# Patient Record
Sex: Male | Born: 1935 | Hispanic: Yes | Marital: Married | Smoking: Never smoker
Health system: Southern US, Community
[De-identification: ages and names within clinical notes are randomized; demographics above are authoritative.]

## PROBLEM LIST (undated history)

## (undated) DIAGNOSIS — I10 Essential (primary) hypertension: Secondary | ICD-10-CM

---

## 2007-02-20 ENCOUNTER — Ambulatory Visit: Payer: Self-pay | Admitting: Ophthalmology

## 2007-02-27 ENCOUNTER — Ambulatory Visit: Payer: Self-pay | Admitting: Ophthalmology

## 2008-02-13 ENCOUNTER — Ambulatory Visit: Payer: Self-pay | Admitting: Gastroenterology

## 2010-05-10 ENCOUNTER — Emergency Department: Payer: Self-pay | Admitting: Emergency Medicine

## 2011-08-29 ENCOUNTER — Ambulatory Visit: Payer: Self-pay | Admitting: Gastroenterology

## 2011-08-31 LAB — PATHOLOGY REPORT

## 2011-10-29 ENCOUNTER — Emergency Department: Payer: Self-pay | Admitting: Emergency Medicine

## 2011-10-29 LAB — CBC WITH DIFFERENTIAL/PLATELET
Basophil #: 0 10*3/uL (ref 0.0–0.1)
Eosinophil #: 0.2 10*3/uL (ref 0.0–0.7)
Eosinophil %: 1.8 %
HCT: 43.5 % (ref 40.0–52.0)
HGB: 14.7 g/dL (ref 13.0–18.0)
Lymphocyte #: 0.5 10*3/uL — ABNORMAL LOW (ref 1.0–3.6)
Lymphocyte %: 5.2 %
MCH: 29.3 pg (ref 26.0–34.0)
MCHC: 33.7 g/dL (ref 32.0–36.0)
Monocyte #: 0.6 x10 3/mm (ref 0.2–1.0)
Platelet: 215 10*3/uL (ref 150–440)
RBC: 5.02 10*6/uL (ref 4.40–5.90)
RDW: 14.4 % (ref 11.5–14.5)

## 2011-10-29 LAB — BASIC METABOLIC PANEL
Anion Gap: 9 (ref 7–16)
BUN: 14 mg/dL (ref 7–18)
Calcium, Total: 9 mg/dL (ref 8.5–10.1)
EGFR (Non-African Amer.): >60
Glucose: 74 mg/dL (ref 65–99)
Osmolality: 280 (ref 275–301)

## 2013-04-03 ENCOUNTER — Ambulatory Visit: Payer: Self-pay | Admitting: Family Medicine

## 2016-07-30 ENCOUNTER — Emergency Department: Payer: Medicare Other

## 2016-07-30 ENCOUNTER — Emergency Department
Admission: EM | Admit: 2016-07-30 | Discharge: 2016-07-30 | Disposition: A | Discharge status: Home / Self Care | Payer: Medicare Other | Attending: Emergency Medicine | Admitting: Emergency Medicine

## 2016-07-30 DIAGNOSIS — J40 Bronchitis, not specified as acute or chronic: Secondary | ICD-10-CM | POA: Insufficient documentation

## 2016-07-30 DIAGNOSIS — I10 Essential (primary) hypertension: Secondary | ICD-10-CM | POA: Diagnosis not present

## 2016-07-30 DIAGNOSIS — R0602 Shortness of breath: Secondary | ICD-10-CM | POA: Diagnosis present

## 2016-07-30 HISTORY — DX: Essential (primary) hypertension: I10

## 2016-07-30 LAB — HEPATIC FUNCTION PANEL
ALBUMIN: 4.1 g/dL (ref 3.5–5.0)
ALK PHOS: 82 U/L (ref 38–126)
ALT: 19 U/L (ref 17–63)
AST: 29 U/L (ref 15–41)
Bilirubin, Direct: 0.1 mg/dL (ref 0.1–0.5)
Indirect Bilirubin: 0.7 mg/dL (ref 0.3–0.9)
TOTAL PROTEIN: 7 g/dL (ref 6.5–8.1)
Total Bilirubin: 0.8 mg/dL (ref 0.3–1.2)

## 2016-07-30 LAB — BASIC METABOLIC PANEL
ANION GAP: 6 (ref 5–15)
BUN: 16 mg/dL (ref 6–20)
CO2: 26 mmol/L (ref 22–32)
Calcium: 9.2 mg/dL (ref 8.9–10.3)
Chloride: 108 mmol/L (ref 101–111)
Creatinine, Ser: 1.04 mg/dL (ref 0.61–1.24)
Glucose, Bld: 103 mg/dL — ABNORMAL HIGH (ref 65–99)
POTASSIUM: 4.1 mmol/L (ref 3.5–5.1)
Sodium: 140 mmol/L (ref 135–145)

## 2016-07-30 LAB — CBC WITH DIFFERENTIAL/PLATELET
BASOS ABS: 0.1 10*3/uL (ref 0–0.1)
BASOS PCT: 1 %
EOS ABS: 0.2 10*3/uL (ref 0–0.7)
Eosinophils Relative: 3 %
HCT: 45.1 % (ref 40.0–52.0)
HEMOGLOBIN: 15.1 g/dL (ref 13.0–18.0)
Lymphocytes Relative: 8 %
Lymphs Abs: 0.7 10*3/uL — ABNORMAL LOW (ref 1.0–3.6)
MCH: 28.2 pg (ref 26.0–34.0)
MCHC: 33.5 g/dL (ref 32.0–36.0)
MCV: 84.1 fL (ref 80.0–100.0)
MONOS PCT: 6 %
Monocytes Absolute: 0.5 10*3/uL (ref 0.2–1.0)
NEUTROS PCT: 82 %
Neutro Abs: 6.8 10*3/uL — ABNORMAL HIGH (ref 1.4–6.5)
Platelets: 219 10*3/uL (ref 150–440)
RBC: 5.35 MIL/uL (ref 4.40–5.90)
RDW: 14.1 % (ref 11.5–14.5)
WBC: 8.2 10*3/uL (ref 3.8–10.6)

## 2016-07-30 LAB — CBC
HEMATOCRIT: 47.2 % (ref 40.0–52.0)
HEMOGLOBIN: 16 g/dL (ref 13.0–18.0)
MCH: 28.6 pg (ref 26.0–34.0)
MCHC: 33.9 g/dL (ref 32.0–36.0)
MCV: 84.2 fL (ref 80.0–100.0)
Platelets: 231 10*3/uL (ref 150–440)
RBC: 5.61 MIL/uL (ref 4.40–5.90)
RDW: 14 % (ref 11.5–14.5)
WBC: 8.6 10*3/uL (ref 3.8–10.6)

## 2016-07-30 LAB — LACTIC ACID, PLASMA: LACTIC ACID, VENOUS: 1 mmol/L (ref 0.5–1.9)

## 2016-07-30 LAB — INFLUENZA PANEL BY PCR (TYPE A & B)
INFLAPCR: NEGATIVE
Influenza B By PCR: NEGATIVE

## 2016-07-30 LAB — TROPONIN I

## 2016-07-30 MED ORDER — AZITHROMYCIN 250 MG PO TABS: ORAL_TABLET | ORAL | 0 refills | Status: AC

## 2016-07-30 MED ORDER — IOPAMIDOL (ISOVUE-300) INJECTION 61%
75.0000 mL | Freq: Once | INTRAVENOUS | Status: AC | PRN
  Administered 2016-07-30: 75 mL via INTRAVENOUS

## 2016-07-30 MED ORDER — SODIUM CHLORIDE 0.9 % IV SOLN
Freq: Once | INTRAVENOUS | Status: AC
  Administered 2016-07-30: 14:00:00 via INTRAVENOUS

## 2016-07-30 MED ORDER — ALBUTEROL SULFATE HFA 108 (90 BASE) MCG/ACT IN AERS: 2.0000 | INHALATION_SPRAY | Freq: Four times a day (QID) | RESPIRATORY_TRACT | 2 refills | Status: AC | PRN

## 2016-07-30 NOTE — ED Notes (Signed)
This RN to bedside due to call light going off. This RN apologized profusely for delay, pt's daughter states, "the service has been really bad here today". This RN explained delay to patient and daughter. Pt's daughter states, " there isn't a nurse here who speaks spanish?" This RN explained to patient that there was not a medically certified translator on staff at this time and would have to wait for interpreter to review D/C instructions. Pt appears agitated at this time due to delay, this RN apologized once again. Pt is visualized in NAD at this time. Pt is resting in bed with his daughter and her husband at bedside. This RN explained that there were other sick patients that needed tending too, pt's daughter states "I don't care I have been here when it has been really really bad and the service today was awful.".

## 2016-07-30 NOTE — ED Notes (Signed)
Dr. Darnelle CatalanMalinda and [place], Interpreter at bedside at this time.

## 2016-07-30 NOTE — ED Notes (Signed)
This RN to bedside, pt's family at bedside at this time, requesting another chair for the room. This RN obtained another chair for them, denies further needs at this time.

## 2016-07-30 NOTE — ED Triage Notes (Signed)
Pt came to ED c/o cough, sob, left arm and back pain. Reports tingling all the way down left arm and numbness in left hand. Reports has been going on for about 1 week.

## 2016-07-30 NOTE — ED Provider Notes (Signed)
[facility] Emergency Department Provider Note   ____________________________________________   First MD Initiated Contact with Patient 07/30/16 1109     (approximate)  I have reviewed the triage vital signs and the nursing notes.   HISTORY  Chief Complaint Shortness of Breath; Arm Pain; and Cough    HPI Mccartney Chuba is a 83 y.o. male patient reports feeling weak this morning. He is having a pain in his back when he coughs. His middle of his back. He is not running a fever. Aching all over but he's had a pain in his left arm from shoulder down into the hand is worse with movement. Appears to be sharp stabbing pain. Been going on for just less than a year. He has not told his doctor about it. He usually sees Phineas Real. Patient also reports that for sometime he's been having a pain just before he starts to urinate but urinates without any difficulty and has no pain while urinating.   Past Medical History:  Diagnosis Date  . Hypertension     There are no active problems to display for this patient.   History reviewed. No pertinent surgical history.  Prior to Admission medications   Not on File    Allergies Patient has no known allergies.  No family history on file.  Social History Social History  Substance Use Topics  . Smoking status: Never Smoker  . Smokeless tobacco: Never Used  . Alcohol use 0.6 oz/week    1 Shots of liquor per week     Comment: shot of tequila daily    Review of Systems Constitutional: No fever/chills Eyes: No visual changes. ENT: No sore throat. Cardiovascular: Denies chest pain. Respiratory: Denies shortness of breath. Gastrointestinal: No abdominal pain.  No nausea, no vomiting.  No diarrhea.  No constipation. Genitourinary: Negative for dysuria. Musculoskeletal: back pain. Skin: Negative for rash. Neurological: Negative for headaches, focal weakness or numbness.  10-point ROS otherwise  negative.  ____________________________________________   PHYSICAL EXAM:  VITAL SIGNS: ED Triage Vitals  Enc Vitals Group     BP 07/30/16 1045 (!) 191/57     Pulse Rate 07/30/16 1045 85     Resp 07/30/16 1045 16     Temp 07/30/16 1045 98.3 F (36.8 C)     Temp Source 07/30/16 1045 Oral     SpO2 07/30/16 1045 97 %     Weight 07/30/16 1042 130 lb (59 kg)     Height 07/30/16 1042 5\' 1"  (1.549 m)     Head Circumference --      Peak Flow --      Pain Score --      Pain Loc --      Pain Edu? --      Excl. in GC? --     Constitutional: Alert and oriented. Well appearing and in no acute distress. Eyes: Conjunctivae are normal. PERRL. EOMI. Head: Atraumatic. Nose: No congestion/rhinnorhea. Mouth/Throat: Mucous membranes are moist.  Oropharynx non-erythematous. Neck: No stridor.  Cardiovascular: Normal rate, regular rhythm. Grossly normal heart sounds.  Good peripheral circulation. Respiratory: Normal respiratory effort.  No retractions. Lungs CTAB. Gastrointestinal: Soft and nontender. No distention. No abdominal bruits. No CVA tenderness. Musculoskeletal: No lower extremity tenderness nor edema.  No joint effusions. Neurologic:  Normal speech and language. No gross focal neurologic deficits are appreciated. Cranial nerves II through XII are intact although I did not check the visual fields motor strength is 5 over 5 throughout sensation is intact  there is no numbness at all associated with the pain in his arm. Skin:  Skin is warm, dry and intact. No rash noted.   ____________________________________________   LABS (all labs ordered are listed, but only abnormal results are displayed)  Labs Reviewed  BASIC METABOLIC PANEL - Abnormal; Notable for the following:       Result Value   Glucose, Bld 103 (*)    All other components within normal limits  CBC WITH DIFFERENTIAL/PLATELET - Abnormal; Notable for the following:    Neutro Abs 6.8 (*)    Lymphs Abs 0.7 (*)    All other  components within normal limits  CBC  TROPONIN I  TROPONIN I  LACTIC ACID, PLASMA  HEPATIC FUNCTION PANEL  INFLUENZA PANEL BY PCR (TYPE A & B)   ____________________________________________  EKG  EKG read and interpreted by me shows normal sinus rhythm rate of 86 normal axis essentially normal EKG ____________________________________________  RADIOLOGY Study Result   CLINICAL DATA:  Cough, shortness of breath.  EXAM: CHEST  2 VIEW  COMPARISON:  Radiographs of Oct 29, 2011.  FINDINGS: The heart size and mediastinal contours are within normal limits. Elevated right hemidiaphragm is noted. No pneumothorax or pleural effusion is noted. Atherosclerosis of thoracic aorta is noted. Both lungs are clear. The visualized skeletal structures are unremarkable.  IMPRESSION: No active cardiopulmonary disease.  Aortic atherosclerosis.   Electronically Signed   By: Lupita RaiderJames  Green Jr, M.D.   On: 07/30/2016 11:30   Study Result   CLINICAL DATA:  C/o cough, sob, left arm and back pain. Reports tingling all the way down left arm and numbness in left hand. Reports has been going on for about 1 week. 75 ml isovue 300 given.^8175mL ISOVUE-300 IOPAMIDOL (ISOVUE-300) INJECTION 61%  EXAM: CT CHEST WITH CONTRAST  TECHNIQUE: Multidetector CT imaging of the chest was performed during intravenous contrast administration.  CONTRAST:  75mL ISOVUE-300 IOPAMIDOL (ISOVUE-300) INJECTION 61%  COMPARISON:  Chest x-ray 02/16/2017  FINDINGS: Cardiovascular: Coronary artery calcifications are present. Heart is normal in size. No pericardial effusion. There is atherosclerotic calcification of the thoracic aorta. No aneurysm. Main pulmonary arteries are normally opacified.  Mediastinum/Nodes: The visualized portion of the thyroid gland has a normal appearance. No mediastinal, hilar, or axillary adenopathy.  Lungs/Pleura: Airways are patent. There is mild perihilar peribronchial  thickening. No focal consolidations or pleural effusions. Minimal atelectasis identified at the lung bases. Elevation of the right hemidiaphragm.  Upper Abdomen: Gallbladder is present.  Musculoskeletal: Degenerative changes are seen in thoracic spine.  IMPRESSION: 1. Mild bronchitic changes. 2. No focal consolidation or pleural effusions. 3. Elevation of the right hemidiaphragm of uncertain etiology. 4. Atherosclerosis of the thoracic aorta.  Coronary artery disease.   Electronically Signed   By: Norva PavlovElizabeth  Brown M.D.   On: 07/30/2016 14:13    ____________________________________________   PROCEDURES  Procedure(s) performed:   Procedures  Critical Care performed:   ____________________________________________   INITIAL IMPRESSION / ASSESSMENT AND PLAN / ED COURSE  Pertinent labs & imaging results that were available during my care of the patient were reviewed by me and considered in my medical decision making (see chart for details).   Urine is clear looking.     ____________________________________________   FINAL CLINICAL IMPRESSION(S) / ED DIAGNOSES  Final diagnoses:  Bronchitis      NEW MEDICATIONS STARTED DURING THIS VISIT:  New Prescriptions   No medications on file     Note:  This document was prepared using Dragon voice  recognition software and may include unintentional dictation errors.    Arnaldo Natal, MD 07/30/16 1630

## 2016-07-30 NOTE — ED Notes (Signed)
MD aware pt's temp 100.3. Pt states he does not want to wait, instructed to begin prescription tonight and take tylenol when he got home for fevers. Pt states understanding at this time. MD aware of patien'ts BP 175/64, states okay for D/C at this time. Pt visualized in NAD at this time. Rafael, Interpreter at bedside at this time to review D/C instructions. Pt refuses wheelchair to the lobby at this time. Pt denies any comments/concerns.

## 2016-07-30 NOTE — ED Notes (Signed)
This RN to bedside to obtain blood and flu swab. This RN used pre-existing IV for blood draw, pt tolerated flu swab well at this time. Will continue to monitor for further patient needs.

## 2016-07-30 NOTE — Discharge Instructions (Signed)
I will give you some Zithromax 2 pills on the first day and one every day after that an antibiotic to help with the cough. You can use an over-the-counter cough medicine as well. I will also give you an albuterol inhaler 2 puffs 4 times a day as long as the cough is going on as I demonstrated. Please return if you're worse or no better in 2-3 days. [facility] Please have your doctor evaluate your arm pain as well. I cannot find anything at present that would explain it.

## 2016-07-30 NOTE — ED Notes (Addendum)
Pt visualized in NAD at this time. Pt resting in bed with family at bedside. Pt's family denies any needs. Will continue to monitor for further patient needs at this time.

## 2016-07-30 NOTE — ED Notes (Signed)
This RN to bedside at this time. Pt's family states that patient is getting tired of laying down. Pt is visualized in NAD. This RN explained to patient family that she would speak with MD. Pt's family states understanding at this time.

## 2017-07-07 ENCOUNTER — Encounter: Payer: Self-pay | Admitting: *Deleted

## 2017-09-21 ENCOUNTER — Emergency Department
Admission: EM | Admit: 2017-09-21 | Discharge: 2017-09-21 | Disposition: A | Discharge status: Home / Self Care | Payer: Medicare Other | Attending: Emergency Medicine | Admitting: Emergency Medicine

## 2017-09-21 ENCOUNTER — Other Ambulatory Visit: Payer: Self-pay

## 2017-09-21 DIAGNOSIS — N289 Disorder of kidney and ureter, unspecified: Secondary | ICD-10-CM

## 2017-09-21 DIAGNOSIS — R112 Nausea with vomiting, unspecified: Secondary | ICD-10-CM | POA: Insufficient documentation

## 2017-09-21 DIAGNOSIS — I10 Essential (primary) hypertension: Secondary | ICD-10-CM | POA: Insufficient documentation

## 2017-09-21 DIAGNOSIS — R197 Diarrhea, unspecified: Secondary | ICD-10-CM | POA: Diagnosis not present

## 2017-09-21 LAB — URINALYSIS, COMPLETE (UACMP) WITH MICROSCOPIC
BILIRUBIN URINE: NEGATIVE
Glucose, UA: NEGATIVE mg/dL
HGB URINE DIPSTICK: NEGATIVE
Ketones, ur: 5 mg/dL — AB
LEUKOCYTES UA: NEGATIVE
Nitrite: NEGATIVE
PROTEIN: 30 mg/dL — AB
SPECIFIC GRAVITY, URINE: 1.019 (ref 1.005–1.030)
pH: 5 (ref 5.0–8.0)

## 2017-09-21 LAB — COMPREHENSIVE METABOLIC PANEL
ALBUMIN: 4.6 g/dL (ref 3.5–5.0)
ALT: 26 U/L (ref 17–63)
AST: 43 U/L — AB (ref 15–41)
Alkaline Phosphatase: 102 U/L (ref 38–126)
Anion gap: 13 (ref 5–15)
BUN: 30 mg/dL — AB (ref 6–20)
CHLORIDE: 101 mmol/L (ref 101–111)
CO2: 22 mmol/L (ref 22–32)
CREATININE: 1.26 mg/dL — AB (ref 0.61–1.24)
Calcium: 9.3 mg/dL (ref 8.9–10.3)
GFR calc Af Amer: 59 mL/min — ABNORMAL LOW (ref >60)
GFR calc non Af Amer: 51 mL/min — ABNORMAL LOW (ref >60)
GLUCOSE: 102 mg/dL — AB (ref 65–99)
POTASSIUM: 4.3 mmol/L (ref 3.5–5.1)
SODIUM: 136 mmol/L (ref 135–145)
Total Bilirubin: 1.2 mg/dL (ref 0.3–1.2)
Total Protein: 8.3 g/dL — ABNORMAL HIGH (ref 6.5–8.1)

## 2017-09-21 LAB — CBC
HEMATOCRIT: 49.7 % (ref 40.0–52.0)
Hemoglobin: 16.5 g/dL (ref 13.0–18.0)
MCH: 27.8 pg (ref 26.0–34.0)
MCHC: 33.2 g/dL (ref 32.0–36.0)
MCV: 83.6 fL (ref 80.0–100.0)
PLATELETS: 241 10*3/uL (ref 150–440)
RBC: 5.95 MIL/uL — ABNORMAL HIGH (ref 4.40–5.90)
RDW: 14.6 % — AB (ref 11.5–14.5)
WBC: 11.9 10*3/uL — ABNORMAL HIGH (ref 3.8–10.6)

## 2017-09-21 LAB — INFLUENZA PANEL BY PCR (TYPE A & B)
Influenza A By PCR: NEGATIVE
Influenza B By PCR: NEGATIVE

## 2017-09-21 LAB — LIPASE, BLOOD: LIPASE: 26 U/L (ref 11–51)

## 2017-09-21 MED ORDER — SODIUM CHLORIDE 0.9 % IV BOLUS
1000.0000 mL | Freq: Once | INTRAVENOUS | Status: AC
  Administered 2017-09-21: 1000 mL via INTRAVENOUS

## 2017-09-21 MED ORDER — ONDANSETRON 4 MG PO TBDP: 4.0000 mg | ORAL_TABLET | Freq: Three times a day (TID) | ORAL | 0 refills | Status: DC | PRN

## 2017-09-21 MED ORDER — ONDANSETRON HCL 4 MG/2ML IJ SOLN
4.0000 mg | Freq: Once | INTRAMUSCULAR | Status: AC
  Administered 2017-09-21: 4 mg via INTRAVENOUS
  Filled 2017-09-21: qty 2

## 2017-09-21 MED ORDER — LOPERAMIDE HCL 2 MG PO TABS: 2.0000 mg | ORAL_TABLET | Freq: Four times a day (QID) | ORAL | 0 refills | Status: DC | PRN

## 2017-09-21 NOTE — ED Provider Notes (Signed)
[facility] Emergency Department Provider Note  ____________________________________________  Time seen: Approximately 7:25 PM  I have reviewed the triage vital signs and the nursing notes.   HISTORY  Chief Complaint Emesis and Diarrhea    HPI Drew Sharp is a 84 y.o. male with a history of hypertension presenting for nausea vomiting and diarrhea.  The patient reports that he ate pork ribs and scrambled eggs last night and in the evening began to have multiple episodes of nausea vomiting and nonbloody diarrhea.  He has not had any abdominal pain, fevers or chills, dysuria.  No cough or cold symptoms.  Past Medical History:  Diagnosis Date  . Hypertension     There are no active problems to display for this patient.   History reviewed. No pertinent surgical history.  Current Outpatient Rx  . Order #: [id number] Class: Print  . Order #: [id number] Class: Print  . Order #: [id number] Class: Print    Allergies Patient has no known allergies.  No family history on file.  Social History Social History   Tobacco Use  . Smoking status: Never Smoker  . Smokeless tobacco: Never Used  Substance Use Topics  . Alcohol use: Yes    Alcohol/week: 0.6 oz    Types: 1 Shots of liquor per week    Comment: shot of tequila daily  . Drug use: No    Review of Systems Constitutional: No fever/chills.  No lightheadedness or syncope.  No diaphoresis. Eyes: No visual changes.  No eye discharge. ENT: No sore throat. No congestion or rhinorrhea. Cardiovascular: Denies chest pain. Denies palpitations. Respiratory: Denies shortness of breath.  No cough. Gastrointestinal: No abdominal pain.  +nausea, +vomiting.  +diarrhea.  No constipation. Genitourinary: Negative for dysuria.  No urinary frequency.  No hematuria. Musculoskeletal: Negative for back pain. Skin: Negative for rash. Neurological: Negative for headaches. No focal numbness, tingling or weakness.      ____________________________________________   PHYSICAL EXAM:  VITAL SIGNS: ED Triage Vitals  Enc Vitals Group     BP 09/21/17 1511 (!) 162/63     Pulse Rate 09/21/17 1511 (!) 113     Resp 09/21/17 1511 16     Temp 09/21/17 1511 98.7 F (37.1 C)     Temp Source 09/21/17 1511 Oral     SpO2 09/21/17 1511 95 %     Weight 09/21/17 1509 140 lb (63.5 kg)     Height 09/21/17 1509 5\' 1"  (1.549 m)     Head Circumference --      Peak Flow --      Pain Score 09/21/17 1509 0     Pain Loc --      Pain Edu? --      Excl. in GC? --     Constitutional: Alert and oriented. Well appearing and in no acute distress. Answers questions appropriately. Eyes: Conjunctivae are normal.  EOMI. No scleral icterus. Head: Atraumatic. Nose: No congestion/rhinnorhea. Mouth/Throat: Mucous membranes are mildly dry.  Neck: No stridor.  Supple.  No JVD.  No meningismus. Cardiovascular: Normal rate, regular rhythm. No murmurs, rubs or gallops.  Respiratory: Normal respiratory effort.  No accessory muscle use or retractions. Lungs CTAB.  No wheezes, rales or ronchi. Gastrointestinal: Soft, nontender and nondistended.  No guarding or rebound.  No peritoneal signs. Musculoskeletal: No LE edema. No ttp in the calves or palpable cords.  Negative Homan's sign. Neurologic:  A&Ox3.  Speech is clear.  Face and smile are symmetric.  EOMI.  Moves all extremities well.  Skin:  Skin is warm, dry and intact. No rash noted. Psychiatric: Mood and affect are normal. Speech and behavior are normal.  Normal judgement  ____________________________________________   LABS (all labs ordered are listed, but only abnormal results are displayed)  Labs Reviewed  COMPREHENSIVE METABOLIC PANEL - Abnormal; Notable for the following components:      Result Value   Glucose, Bld 102 (*)    BUN 30 (*)    Creatinine, Ser 1.26 (*)    Total Protein 8.3 (*)    AST 43 (*)    GFR calc non Af Amer 51 (*)    GFR calc Af Amer 59 (*)     All other components within normal limits  CBC - Abnormal; Notable for the following components:   WBC 11.9 (*)    RBC 5.95 (*)    RDW 14.6 (*)    All other components within normal limits  URINALYSIS, COMPLETE (UACMP) WITH MICROSCOPIC - Abnormal; Notable for the following components:   Color, Urine AMBER (*)    APPearance HAZY (*)    Ketones, ur 5 (*)    Protein, ur 30 (*)    Bacteria, UA RARE (*)    Squamous Epithelial / LPF 0-5 (*)    All other components within normal limits  URINE CULTURE  C DIFFICILE QUICK SCREEN W PCR REFLEX  GASTROINTESTINAL PANEL BY PCR, STOOL (REPLACES STOOL CULTURE)  LIPASE, BLOOD  INFLUENZA PANEL BY PCR (TYPE A & B)   ____________________________________________  EKG  ED ECG REPORT I, Rockne Menghini, the attending physician, personally viewed and interpreted this ECG.   Date: 09/21/2017  EKG Time: 1933  Rate: 100  Rhythm: sinus tachycardia  Axis: normal  Intervals:none  ST&T Change: No STEMI  ____________________________________________  RADIOLOGY  No results found.  ____________________________________________   PROCEDURES  Procedure(s) performed: None  Procedures  Critical Care performed: No ____________________________________________   INITIAL IMPRESSION / ASSESSMENT AND PLAN / ED COURSE  Pertinent labs & imaging results that were available during my care of the patient were reviewed by me and considered in my medical decision making (see chart for details).  84 y.o. male presenting with nausea vomiting and diarrhea without fever, chills, urinary symptoms or abdominal pain.  Overall, the patient is afebrile and has a completely benign abdominal examination.  His symptoms are most consistent with a food borne or viral GI illness, and we will also rule out influenza.  His white blood cell count is 11 today, which is consistent with the infection or inflammation that is causing him symptoms but I do not suspect an acute  intra-abdominal surgical pathology or severe life-threatening intra-abdominal infection.  No imaging is indicated at this time.  I will treat the patient with intravenous fluids, and antiemetic, and reevaluate him for final disposition.  ----------------------------------------- 9:21 PM on 09/21/2017 -----------------------------------------  Time, the patient is feeling much better and he is tolerating liquids without any difficulty.  He is stable for discharge.  I have discussed follow-up as well as return percussions with him and his wife. ____________________________________________  FINAL CLINICAL IMPRESSION(S) / ED DIAGNOSES  Final diagnoses:  Nausea vomiting and diarrhea  Renal insufficiency         NEW MEDICATIONS STARTED DURING THIS VISIT:  New Prescriptions   LOPERAMIDE (IMODIUM A-D) 2 MG TABLET    Take 1 tablet (2 mg total) by mouth 4 (four) times daily as needed for diarrhea or loose stools.   ONDANSETRON (ZOFRAN ODT) 4 MG DISINTEGRATING TABLET  Take 1 tablet (4 mg total) by mouth every 8 (eight) hours as needed for nausea or vomiting.      Rockne Menghini, MD 09/21/17 2153

## 2017-09-21 NOTE — ED Triage Notes (Signed)
Interpreter present - pt states he has vomiting and diarrhea since last pm (vomited x5, loose stools x5)

## 2017-09-21 NOTE — ED Triage Notes (Signed)
FIRST NURSE NOTE-abdominal pain with NVD. Ambulatory. NAD

## 2017-09-21 NOTE — ED Notes (Signed)
INTERPRETOR REQUESTED VIA ONLINE PORTAL 

## 2017-09-21 NOTE — Discharge Instructions (Addendum)
Por favor, sique con un dieta de "clear liquids" por 24 a 48 horas, y despues tome una dieta "bland."  Drew SatoHaga una cita con su doctor para re-evaluacion y para ver que sus rinones se mejoraron.  Vuelva a la sala de emergencia si tenga dolor, fiebre, vomito, o por cualquier otro problema medical.

## 2017-09-23 LAB — URINE CULTURE: Culture: NO GROWTH

## 2018-11-27 ENCOUNTER — Other Ambulatory Visit
Admission: RE | Admit: 2018-11-27 | Discharge: 2018-11-27 | Disposition: A | Discharge status: Home / Self Care | Payer: Medicare Other | Source: Ambulatory Visit | Attending: Student | Admitting: Student

## 2018-11-27 DIAGNOSIS — R5383 Other fatigue: Secondary | ICD-10-CM | POA: Diagnosis present

## 2018-11-27 DIAGNOSIS — R0602 Shortness of breath: Secondary | ICD-10-CM | POA: Insufficient documentation

## 2018-11-27 DIAGNOSIS — M7989 Other specified soft tissue disorders: Secondary | ICD-10-CM | POA: Diagnosis not present

## 2018-11-27 LAB — BRAIN NATRIURETIC PEPTIDE: B Natriuretic Peptide: 63 pg/mL (ref 0.0–100.0)

## 2018-12-24 ENCOUNTER — Other Ambulatory Visit: Payer: Self-pay | Admitting: Internal Medicine

## 2018-12-24 DIAGNOSIS — R140 Abdominal distension (gaseous): Secondary | ICD-10-CM

## 2018-12-24 DIAGNOSIS — R110 Nausea: Secondary | ICD-10-CM

## 2020-04-27 ENCOUNTER — Emergency Department: Payer: Medicare Other

## 2020-04-27 ENCOUNTER — Other Ambulatory Visit: Payer: Self-pay

## 2020-04-27 ENCOUNTER — Encounter: Payer: Self-pay | Admitting: *Deleted

## 2020-04-27 ENCOUNTER — Inpatient Hospital Stay
Admission: EM | Admit: 2020-04-27 | Discharge: 2020-04-29 | DRG: 391 | Disposition: A | Discharge status: Home / Self Care | Payer: Medicare Other | Attending: Internal Medicine | Admitting: Internal Medicine

## 2020-04-27 DIAGNOSIS — I129 Hypertensive chronic kidney disease with stage 1 through stage 4 chronic kidney disease, or unspecified chronic kidney disease: Secondary | ICD-10-CM | POA: Diagnosis present

## 2020-04-27 DIAGNOSIS — E860 Dehydration: Secondary | ICD-10-CM | POA: Diagnosis present

## 2020-04-27 DIAGNOSIS — N179 Acute kidney failure, unspecified: Secondary | ICD-10-CM

## 2020-04-27 DIAGNOSIS — D751 Secondary polycythemia: Secondary | ICD-10-CM | POA: Diagnosis present

## 2020-04-27 DIAGNOSIS — A059 Bacterial foodborne intoxication, unspecified: Secondary | ICD-10-CM | POA: Diagnosis present

## 2020-04-27 DIAGNOSIS — Z20822 Contact with and (suspected) exposure to covid-19: Secondary | ICD-10-CM | POA: Diagnosis present

## 2020-04-27 DIAGNOSIS — N189 Chronic kidney disease, unspecified: Secondary | ICD-10-CM | POA: Diagnosis present

## 2020-04-27 DIAGNOSIS — R112 Nausea with vomiting, unspecified: Secondary | ICD-10-CM | POA: Diagnosis present

## 2020-04-27 DIAGNOSIS — I10 Essential (primary) hypertension: Secondary | ICD-10-CM | POA: Diagnosis not present

## 2020-04-27 DIAGNOSIS — E872 Acidosis: Secondary | ICD-10-CM | POA: Diagnosis present

## 2020-04-27 DIAGNOSIS — Z791 Long term (current) use of non-steroidal anti-inflammatories (NSAID): Secondary | ICD-10-CM | POA: Diagnosis not present

## 2020-04-27 DIAGNOSIS — Z23 Encounter for immunization: Secondary | ICD-10-CM | POA: Diagnosis present

## 2020-04-27 DIAGNOSIS — Z79899 Other long term (current) drug therapy: Secondary | ICD-10-CM

## 2020-04-27 DIAGNOSIS — K921 Melena: Secondary | ICD-10-CM | POA: Diagnosis not present

## 2020-04-27 DIAGNOSIS — A419 Sepsis, unspecified organism: Secondary | ICD-10-CM

## 2020-04-27 DIAGNOSIS — R197 Diarrhea, unspecified: Secondary | ICD-10-CM

## 2020-04-27 DIAGNOSIS — Z972 Presence of dental prosthetic device (complete) (partial): Secondary | ICD-10-CM | POA: Diagnosis not present

## 2020-04-27 DIAGNOSIS — K5900 Constipation, unspecified: Secondary | ICD-10-CM | POA: Diagnosis present

## 2020-04-27 DIAGNOSIS — K2971 Gastritis, unspecified, with bleeding: Secondary | ICD-10-CM | POA: Diagnosis present

## 2020-04-27 LAB — LACTIC ACID, PLASMA
Lactic Acid, Venous: 3.1 mmol/L (ref 0.5–1.9)
Lactic Acid, Venous: 2.7 mmol/L (ref 0.5–1.9)

## 2020-04-27 LAB — CBC
HCT: 55.5 % — ABNORMAL HIGH (ref 39.0–52.0)
Hemoglobin: 18.6 g/dL — ABNORMAL HIGH (ref 13.0–17.0)
MCH: 28.4 pg (ref 26.0–34.0)
MCHC: 33.5 g/dL (ref 30.0–36.0)
MCV: 84.7 fL (ref 80.0–100.0)
Platelets: 293 10*3/uL (ref 150–400)
RBC: 6.55 MIL/uL — ABNORMAL HIGH (ref 4.22–5.81)
RDW: 14.2 % (ref 11.5–15.5)
WBC: 19.5 10*3/uL — ABNORMAL HIGH (ref 4.0–10.5)
nRBC: 0 % (ref 0.0–0.2)

## 2020-04-27 LAB — APTT: aPTT: 29 seconds (ref 24–36)

## 2020-04-27 LAB — COMPREHENSIVE METABOLIC PANEL
ALT: 20 U/L (ref 0–44)
AST: 34 U/L (ref 15–41)
Albumin: 4.9 g/dL (ref 3.5–5.0)
Alkaline Phosphatase: 107 U/L (ref 38–126)
Anion gap: 16 — ABNORMAL HIGH (ref 5–15)
BUN: 35 mg/dL — ABNORMAL HIGH (ref 8–23)
CO2: 25 mmol/L (ref 22–32)
Calcium: 10.2 mg/dL (ref 8.9–10.3)
Chloride: 101 mmol/L (ref 98–111)
Creatinine, Ser: 1.85 mg/dL — ABNORMAL HIGH (ref 0.61–1.24)
GFR, Estimated: 35 mL/min — ABNORMAL LOW (ref >60)
Glucose, Bld: 136 mg/dL — ABNORMAL HIGH (ref 70–99)
Potassium: 4.4 mmol/L (ref 3.5–5.1)
Sodium: 142 mmol/L (ref 135–145)
Total Bilirubin: 1.6 mg/dL — ABNORMAL HIGH (ref 0.3–1.2)
Total Protein: 8.8 g/dL — ABNORMAL HIGH (ref 6.5–8.1)

## 2020-04-27 LAB — RESPIRATORY PANEL BY RT PCR (FLU A&B, COVID)
Influenza A by PCR: NEGATIVE
Influenza B by PCR: NEGATIVE
SARS Coronavirus 2 by RT PCR: NEGATIVE

## 2020-04-27 LAB — TROPONIN I (HIGH SENSITIVITY)
Troponin I (High Sensitivity): 9 ng/L (ref <18)
Troponin I (High Sensitivity): 11 ng/L (ref <18)

## 2020-04-27 LAB — PROTIME-INR
INR: 1 (ref 0.8–1.2)
Prothrombin Time: 13 seconds (ref 11.4–15.2)

## 2020-04-27 LAB — PROCALCITONIN: Procalcitonin: 2.5 ng/mL

## 2020-04-27 LAB — LIPASE, BLOOD: Lipase: 28 U/L (ref 11–51)

## 2020-04-27 MED ORDER — ONDANSETRON HCL 4 MG PO TABS: 4.0000 mg | ORAL_TABLET | Freq: Four times a day (QID) | ORAL | Status: DC | PRN

## 2020-04-27 MED ORDER — PANTOPRAZOLE SODIUM 40 MG IV SOLR
40.0000 mg | INTRAVENOUS | Status: DC
  Administered 2020-04-27: 40 mg via INTRAVENOUS
  Filled 2020-04-27: qty 40

## 2020-04-27 MED ORDER — ALBUTEROL SULFATE HFA 108 (90 BASE) MCG/ACT IN AERS
2.0000 | INHALATION_SPRAY | Freq: Four times a day (QID) | RESPIRATORY_TRACT | Status: DC | PRN
  Filled 2020-04-27: qty 6.7

## 2020-04-27 MED ORDER — SODIUM CHLORIDE 0.9 % IV BOLUS
1000.0000 mL | Freq: Once | INTRAVENOUS | Status: AC
  Administered 2020-04-27: 1000 mL via INTRAVENOUS

## 2020-04-27 MED ORDER — SODIUM CHLORIDE 0.9 % IV SOLN
2.0000 g | Freq: Once | INTRAVENOUS | Status: AC
  Administered 2020-04-27: 2 g via INTRAVENOUS
  Filled 2020-04-27: qty 2

## 2020-04-27 MED ORDER — ONDANSETRON HCL 4 MG/2ML IJ SOLN: 4.0000 mg | Freq: Four times a day (QID) | INTRAMUSCULAR | Status: DC | PRN

## 2020-04-27 MED ORDER — ACETAMINOPHEN 650 MG RE SUPP: 650.0000 mg | Freq: Four times a day (QID) | RECTAL | Status: DC | PRN

## 2020-04-27 MED ORDER — CEFEPIME HCL 2 G IJ SOLR
2.0000 g | INTRAMUSCULAR | Status: DC
  Administered 2020-04-29: 2 g via INTRAVENOUS
  Filled 2020-04-27 (×2): qty 2

## 2020-04-27 MED ORDER — THIAMINE HCL 100 MG/ML IJ SOLN
100.0000 mg | Freq: Every day | INTRAMUSCULAR | Status: DC
  Administered 2020-04-27 – 2020-04-29 (×3): 100 mg via INTRAVENOUS
  Filled 2020-04-27 (×4): qty 2

## 2020-04-27 MED ORDER — ONDANSETRON HCL 4 MG/2ML IJ SOLN
4.0000 mg | Freq: Once | INTRAMUSCULAR | Status: AC
  Administered 2020-04-27: 4 mg via INTRAVENOUS
  Filled 2020-04-27: qty 2

## 2020-04-27 MED ORDER — ACETAMINOPHEN 325 MG PO TABS: 650.0000 mg | ORAL_TABLET | Freq: Four times a day (QID) | ORAL | Status: DC | PRN

## 2020-04-27 MED ORDER — CIPROFLOXACIN IN D5W 400 MG/200ML IV SOLN
400.0000 mg | Freq: Once | INTRAVENOUS | Status: AC
  Administered 2020-04-27: 400 mg via INTRAVENOUS
  Filled 2020-04-27: qty 200

## 2020-04-27 MED ORDER — LACTATED RINGERS IV BOLUS
1000.0000 mL | Freq: Once | INTRAVENOUS | Status: AC
  Administered 2020-04-27: 1000 mL via INTRAVENOUS

## 2020-04-27 MED ORDER — MORPHINE SULFATE (PF) 2 MG/ML IV SOLN
2.0000 mg | Freq: Once | INTRAVENOUS | Status: AC
  Administered 2020-04-27: 2 mg via INTRAVENOUS
  Filled 2020-04-27: qty 1

## 2020-04-27 MED ORDER — METRONIDAZOLE IN NACL 5-0.79 MG/ML-% IV SOLN
500.0000 mg | Freq: Once | INTRAVENOUS | Status: DC
  Administered 2020-04-27: 500 mg via INTRAVENOUS
  Filled 2020-04-27: qty 100

## 2020-04-27 MED ORDER — IOHEXOL 300 MG/ML  SOLN
75.0000 mL | Freq: Once | INTRAMUSCULAR | Status: AC | PRN
  Administered 2020-04-27: 75 mL via INTRAVENOUS

## 2020-04-27 MED ORDER — METRONIDAZOLE IN NACL 5-0.79 MG/ML-% IV SOLN
500.0000 mg | Freq: Three times a day (TID) | INTRAVENOUS | Status: DC
  Administered 2020-04-28 – 2020-04-29 (×4): 500 mg via INTRAVENOUS
  Filled 2020-04-27 (×6): qty 100

## 2020-04-27 MED ORDER — SODIUM CHLORIDE 0.9 % IV BOLUS (SEPSIS)
500.0000 mL | Freq: Once | INTRAVENOUS | Status: AC
  Administered 2020-04-27: 500 mL via INTRAVENOUS

## 2020-04-27 MED ORDER — HEPARIN SODIUM (PORCINE) 5000 UNIT/ML IJ SOLN
5000.0000 [IU] | Freq: Three times a day (TID) | INTRAMUSCULAR | Status: DC
  Administered 2020-04-27 – 2020-04-28 (×3): 5000 [IU] via SUBCUTANEOUS
  Filled 2020-04-27 (×3): qty 1

## 2020-04-27 MED ORDER — ACETAMINOPHEN 500 MG PO TABS
1000.0000 mg | ORAL_TABLET | Freq: Once | ORAL | Status: AC
  Administered 2020-04-27: 1000 mg via ORAL
  Filled 2020-04-27: qty 2

## 2020-04-27 NOTE — ED Notes (Signed)
Unable to void at this time.

## 2020-04-27 NOTE — Assessment & Plan Note (Signed)
Pt meets sepsis criteria with fever/rr/ HR and suspect GI source, lactic and covid are pending. We will cont ivf hydration as he is hemoconcentrated  but his vitals are otherwise stabel. Blood pressure (!) 149/83, pulse (!) 102, temperature (!) 102.3 F (39.1 C), temperature source Oral, resp. rate 19, height 5\' 1"  (1.549 m), weight 63.5 kg, SpO2 93 %. Cultures have been collected in ED. Anti-infectives (From admission, onward)   Start     Dose/Rate Route Frequency Ordered Stop   04/27/20 2030  ciprofloxacin (CIPRO) IVPB 400 mg        400 mg 200 mL/hr over 60 Minutes Intravenous  Once 04/27/20 2019     04/27/20 2030  metroNIDAZOLE (FLAGYL) IVPB 500 mg        500 mg 100 mL/hr over 60 Minutes Intravenous  Once 04/27/20 2019

## 2020-04-27 NOTE — Progress Notes (Signed)
Pharmacy Antibiotic Note  Drew Sharp is a 85 y.o. male admitted on 04/27/2020 with sepsis.  Pharmacy has been consulted for Cefepime dosing. CrCl = 23 ml/min   Plan: Cefepime 2 gm IV Q24H ordered.   Height: 5\' 1"  (154.9 cm) Weight: 63.5 kg (140 lb) IBW/kg (Calculated) : 52.3  Temp (24hrs), Avg:100.5 F (38.1 C), Min:98.6 F (37 C), Max:102.3 F (39.1 C)  Recent Labs  Lab 04/27/20 1834 04/27/20 2021  WBC 19.5*  --   CREATININE 1.85*  --   LATICACIDVEN  --  2.7*   3.1*    Estimated Creatinine Clearance: 23 mL/min (A) (by C-G formula based on SCr of 1.85 mg/dL (H)).    No Known Allergies  Antimicrobials this admission:   >>    >>   Dose adjustments this admission:   Microbiology results:  BCx:  UCx:    Sputum:    MRSA PCR:   Thank you for allowing pharmacy to be a part of this patients care.  Clenton Esper D 04/27/2020 11:08 PM

## 2020-04-27 NOTE — Progress Notes (Signed)
CODE SEPSIS - PHARMACY COMMUNICATION  **Broad Spectrum Antibiotics should be administered within 1 hour of Sepsis diagnosis**  Time Code Sepsis Called/Page Received: 11/01 @ 2254  Antibiotics Ordered:  Metronidazole, cipro, cefepime   Time of 1st antibiotic administration:  Ciprofloxacin 400 mg IV X 1  On 11/1 @ 2047   Additional action taken by pharmacy: none   If necessary, Name of Provider/Nurse Contacted:     Sandon Yoho D ,PharmD Clinical Pharmacist  04/27/2020  11:07 PM

## 2020-04-27 NOTE — ED Triage Notes (Addendum)
Pt triage via wheelchair.  Pt has vomiting x 3 today.  Diarrhea several times today and last night. No etoh use.  No chest or sob.    Interpreter on a stick used.  Pt alert.  Speech clear

## 2020-04-27 NOTE — ED Provider Notes (Signed)
[facility] Emergency Department Provider Note   ____________________________________________   First MD Initiated Contact with Patient 04/27/20 1949     (approximate)  I have reviewed the triage vital signs and the nursing notes.   HISTORY  Chief Complaint Emesis and Diarrhea    HPI Drew Sharp is a 86 y.o. male who complains of diarrhea starting last night and vomiting at least 3 times today.  He is been unable to keep down fluids today in spite of trying to drink because he is feeling dehydrated.  The diarrhea stool is liquidy.  He has some blood on the toilet tissue when he wipes.  He is having occasional bad cramps and some achiness in the mid abdomen that he thinks is from vomiting so much.  On arrival here he was noted to have 102 fever.  He did not realize he had a fever before this.   Patient's only medical problem is hypertension he says.  He takes a pill for this. Review of old records show that he takes chlorthalidone 25 mg a day and irbesartan or Avapro 150 mg once a day    Past Medical History:  Diagnosis Date  . Hypertension     There are no problems to display for this patient.   No past surgical history on file.  Prior to Admission medications   Medication Sig Start Date End Date Taking? Authorizing Provider  albuterol (PROVENTIL HFA;VENTOLIN HFA) 108 (90 Base) MCG/ACT inhaler Inhale 2 puffs into the lungs every 6 (six) hours as needed for wheezing or shortness of breath. 07/30/16   Arnaldo Natal, MD  loperamide (IMODIUM A-D) 2 MG tablet Take 1 tablet (2 mg total) by mouth 4 (four) times daily as needed for diarrhea or loose stools. 09/21/17   Rockne Menghini, MD  ondansetron (ZOFRAN ODT) 4 MG disintegrating tablet Take 1 tablet (4 mg total) by mouth every 8 (eight) hours as needed for nausea or vomiting. 09/21/17   Rockne Menghini, MD    Allergies Patient has no known allergies.  No family history on file.  Social  History Social History   Tobacco Use  . Smoking status: Never Smoker  . Smokeless tobacco: Never Used  Substance Use Topics  . Alcohol use: Yes    Alcohol/week: 1.0 standard drink    Types: 1 Shots of liquor per week    Comment: shot of tequila daily  . Drug use: No    Review of Systems  Constitutional:  fever/chills Eyes: No visual changes. ENT: No sore throat. Cardiovascular: Denies chest pain. Respiratory: Denies shortness of breath. Gastrointestinal: Crampy abdominal pain which is diffuse along with some midline achiness.   nausea vomiting and diarrhea.  No constipation. Genitourinary: Negative for dysuria. Musculoskeletal: Negative for back pain. Skin: Negative for rash. Neurological: Negative for headaches, focal weakness  ____________________________________________   PHYSICAL EXAM:  VITAL SIGNS: ED Triage Vitals  Enc Vitals Group     BP 04/27/20 1826 (!) 154/84     Pulse Rate 04/27/20 1826 (!) 112     Resp 04/27/20 1826 (!) 22     Temp 04/27/20 1826 (!) 102.3 F (39.1 C)     Temp Source 04/27/20 1826 Oral     SpO2 04/27/20 1826 95 %     Weight 04/27/20 1827 140 lb (63.5 kg)     Height 04/27/20 1827 5\' 1"  (1.549 m)     Head Circumference --      Peak Flow --  Pain Score 04/27/20 1827 4     Pain Loc --      Pain Edu? --      Excl. in GC? --     Constitutional: Alert and oriented. Well appearing and in no acute distress. Eyes: Conjunctivae are normal.  Head: Atraumatic. Nose: No congestion/rhinnorhea. Mouth/Throat: Mucous membranes are moist.  Oropharynx non-erythematous. Neck: No stridor.  Cardiovascular: Normal rate, regular rhythm. Grossly normal heart sounds.  Good peripheral circulation. Respiratory: Normal respiratory effort.  No retractions. Lungs CTAB. Gastrointestinal: Soft mildly tender in midline just above the umbilicus no distention. No abdominal bruits.  Patient is having crampy abdominal pain as well Musculoskeletal: No lower  extremity tenderness nor edema.   Neurologic:  Normal speech and language. No gross focal neurologic deficits are appreciated.  Skin:  Skin is warm, dry and intact. No rash noted.  ____________________________________________   LABS (all labs ordered are listed, but only abnormal results are displayed)  Labs Reviewed  COMPREHENSIVE METABOLIC PANEL - Abnormal; Notable for the following components:      Result Value   Glucose, Bld 136 (*)    BUN 35 (*)    Creatinine, Ser 1.85 (*)    Total Protein 8.8 (*)    Total Bilirubin 1.6 (*)    GFR, Estimated 35 (*)    Anion gap 16 (*)    All other components within normal limits  CBC - Abnormal; Notable for the following components:   WBC 19.5 (*)    RBC 6.55 (*)    Hemoglobin 18.6 (*)    HCT 55.5 (*)    All other components within normal limits  URINE CULTURE  CULTURE, BLOOD (ROUTINE X 2)  CULTURE, BLOOD (ROUTINE X 2)  RESPIRATORY PANEL BY RT PCR (FLU A&B, COVID)  GASTROINTESTINAL PANEL BY PCR, STOOL (REPLACES STOOL CULTURE)  C DIFFICILE QUICK SCREEN W PCR REFLEX  LIPASE, BLOOD  URINALYSIS, COMPLETE (UACMP) WITH MICROSCOPIC  LACTIC ACID, PLASMA  LACTIC ACID, PLASMA  PROTIME-INR  APTT  TROPONIN I (HIGH SENSITIVITY)  TROPONIN I (HIGH SENSITIVITY)   ____________________________________________  EKG   ____________________________________________  RADIOLOGY Jill Poling, personally viewed and evaluated these images (plain radiographs) as part of my medical decision making, as well as reviewing the written report by the radiologist.  ED MD interpretation:  Official radiology report(s): No results found.  ____________________________________________   PROCEDURES  Procedure(s) performed (including Critical Care):  Procedures   ____________________________________________   INITIAL IMPRESSION / ASSESSMENT AND PLAN / ED COURSE  Patient not tolerating p.o. fluids.  He is 85 years old and meet sepsis criteria.   We will begin giving him fluids and antibiotics immediately after getting all of the blood cultures etc.  We will attempt to collect stool for lab as well.             ____________________________________________   FINAL CLINICAL IMPRESSION(S) / ED DIAGNOSES  Final diagnoses:  Dehydration  Nausea vomiting and diarrhea  Sepsis, due to unspecified organism, unspecified whether acute organ dysfunction present [facility])     ED Discharge Orders    None      *Please note:  Leith Szafranski was evaluated in Emergency Department on 04/27/2020 for the symptoms described in the history of present illness. He was evaluated in the context of the global COVID-19 pandemic, which necessitated consideration that the patient might be at risk for infection with the SARS-CoV-2 virus that causes COVID-19. Institutional protocols and algorithms that pertain to the evaluation of patients  at risk for COVID-19 are in a state of rapid change based on information released by regulatory bodies including the CDC and federal and state organizations. These policies and algorithms were followed during the patient's care in the ED.  Some ED evaluations and interventions may be delayed as a result of limited staffing during and the pandemic.*   Note:  This document was prepared using Dragon voice recognition software and may include unintentional dictation errors.    Arnaldo Natal, MD 04/27/20 2022

## 2020-04-27 NOTE — ED Notes (Signed)
PT's granddaughter updated with pt's permission. Then gave pt phone so he could speak with her .

## 2020-04-27 NOTE — H&P (Signed)
History and Physical    Drew Sharp [id number] DOB: 10/06/1931 DOA: 04/27/2020  [facility]    Patient coming from:  home   Chief Complaint:  N/V/D   HPI: Drew Sharp is a 86 y.o. male with medical history significant of htn come to ed for N/V/D since yesterday night. Pt has not been able to keep anything down. Pt meets sepsis criteria due to temp/tachy rr and AKI and pt has no obvious source.    ED Course:  Vitals:   04/27/20 2010 04/27/20 2030 04/27/20 2100 04/27/20 2130  BP: (!) 140/96 (!) 149/83 (!) 143/66 (!) 117/48  Pulse: (!) 106 (!) 102 92 95  Resp: (!) 22 19 15  (!) 25  Temp:      TempSrc:      SpO2: 93% 93% 93% (!) 86%  Weight:      Height:      In ED lactic acid/ ct abd pelvis/ covid/ stool studies pending. Current labs show thrombocytosis and chart review show spt has h/o it in problem list I will check etoh level.lipase normal. Kidney function has gone up due to dehydration.   Review of Systems:  Review of Systems  Constitutional: Positive for chills, fever and malaise/fatigue.  HENT: Negative.   Eyes: Negative.   Respiratory: Negative.   Cardiovascular: Negative.   Gastrointestinal: Positive for diarrhea, nausea and vomiting.  Genitourinary: Negative.   Musculoskeletal: Negative.   Skin: Negative.   Neurological: Negative.      Past Medical History:  Diagnosis Date  . Hypertension     History reviewed. No pertinent surgical history.   reports that he has never smoked. He has never used smokeless tobacco. He reports current alcohol use of about 1.0 standard drink of alcohol per week. He reports that he does not use drugs.  No Known Allergies  History reviewed. No pertinent family history.  Prior to Admission medications   Medication Sig Start Date End Date Taking? Authorizing Provider  albuterol (PROVENTIL HFA;VENTOLIN HFA) 108 (90 Base) MCG/ACT inhaler Inhale 2 puffs into the lungs every 6 (six) hours  as needed for wheezing or shortness of breath. 07/30/16   Arnaldo NatalMalinda, Paul F, MD  loperamide (IMODIUM A-D) 2 MG tablet Take 1 tablet (2 mg total) by mouth 4 (four) times daily as needed for diarrhea or loose stools. 09/21/17   Rockne MenghiniNorman, Anne-Caroline, MD  ondansetron (ZOFRAN ODT) 4 MG disintegrating tablet Take 1 tablet (4 mg total) by mouth every 8 (eight) hours as needed for nausea or vomiting. 09/21/17   Rockne MenghiniNorman, Anne-Caroline, MD    Physical Exam: Vitals:   04/27/20 2010 04/27/20 2030 04/27/20 2100 04/27/20 2130  BP: (!) 140/96 (!) 149/83 (!) 143/66 (!) 117/48  Pulse: (!) 106 (!) 102 92 95  Resp: (!) 22 19 15  (!) 25  Temp:      TempSrc:      SpO2: 93% 93% 93% (!) 86%  Weight:      Height:        Physical Exam Vitals and nursing note reviewed.  Constitutional:      Appearance: He is ill-appearing.  HENT:     Head: Normocephalic and atraumatic.     Right Ear: External ear normal.     Left Ear: External ear normal.     Nose: Nose normal.     Mouth/Throat:     Mouth: Mucous membranes are dry.  Eyes:     Extraocular Movements: Extraocular movements intact.     Pupils:  Pupils are equal, round, and reactive to light.  Cardiovascular:     Rate and Rhythm: Normal rate and regular rhythm.     Pulses: Normal pulses.     Heart sounds: Normal heart sounds.  Abdominal:     General: Bowel sounds are normal. There is no distension.     Palpations: There is no mass.     Tenderness: There is abdominal tenderness.  Musculoskeletal:        General: Normal range of motion.  Skin:    General: Skin is warm.  Neurological:     General: No focal deficit present.     Mental Status: He is oriented to person, place, and time.      Labs on Admission: I have personally reviewed following labs and imaging studies  CBC: Recent Labs  Lab 04/27/20 1834  WBC 19.5*  HGB 18.6*  HCT 55.5*  MCV 84.7  PLT 293   Basic Metabolic Panel: Recent Labs  Lab 04/27/20 1834  NA 142  K 4.4  CL 101  CO2  25  GLUCOSE 136*  BUN 35*  CREATININE 1.85*  CALCIUM 10.2   GFR: Estimated Creatinine Clearance: 23 mL/min (A) (by C-G formula based on SCr of 1.85 mg/dL (H)). Liver Function Tests: Recent Labs  Lab 04/27/20 1834  AST 34  ALT 20  ALKPHOS 107  BILITOT 1.6*  PROT 8.8*  ALBUMIN 4.9   Recent Labs  Lab 04/27/20 1834  LIPASE 28   No results for input(s): AMMONIA in the last 168 hours. Coagulation Profile: Recent Labs  Lab 04/27/20 2021  INR 1.0   Cardiac Enzymes: No results for input(s): CKTOTAL, CKMB, CKMBINDEX, TROPONINI in the last 168 hours. BNP (last 3 results) No results for input(s): PROBNP in the last 8760 hours. HbA1C: No results for input(s): HGBA1C in the last 72 hours. CBG: No results for input(s): GLUCAP in the last 168 hours. Lipid Profile: No results for input(s): CHOL, HDL, LDLCALC, TRIG, CHOLHDL, LDLDIRECT in the last 72 hours. Thyroid Function Tests: No results for input(s): TSH, T4TOTAL, FREET4, T3FREE, THYROIDAB in the last 72 hours. Anemia Panel: No results for input(s): VITAMINB12, FOLATE, FERRITIN, TIBC, IRON, RETICCTPCT in the last 72 hours. Urine analysis:    Component Value Date/Time   COLORURINE AMBER (A) 09/21/2017 1539   APPEARANCEUR HAZY (A) 09/21/2017 1539   LABSPEC 1.019 09/21/2017 1539   PHURINE 5.0 09/21/2017 1539   GLUCOSEU NEGATIVE 09/21/2017 1539   HGBUR NEGATIVE 09/21/2017 1539   BILIRUBINUR NEGATIVE 09/21/2017 1539   KETONESUR 5 (A) 09/21/2017 1539   PROTEINUR 30 (A) 09/21/2017 1539   NITRITE NEGATIVE 09/21/2017 1539   LEUKOCYTESUR NEGATIVE 09/21/2017 1539    Intake/Output Summary (Last 24 hours) at 04/27/2020 2256 Last data filed at 04/27/2020 2156 Gross per 24 hour  Intake 1282.05 ml  Output --  Net 1282.05 ml   Lab Results  Component Value Date   CREATININE 1.85 (H) 04/27/2020   CREATININE 1.26 (H) 09/21/2017   CREATININE 1.04 07/30/2016    COVID-19 Labs  No results for input(s): DDIMER, FERRITIN, LDH, CRP  in the last 72 hours.  Lab Results  Component Value Date   SARSCOV2NAA NEGATIVE 04/27/2020    Radiological Exams on Admission: CT ABDOMEN PELVIS W CONTRAST  Result Date: 04/27/2020 CLINICAL DATA:  86 year old with abdominal pain and fever. Diarrhea and vomiting. EXAM: CT ABDOMEN AND PELVIS WITH CONTRAST TECHNIQUE: Multidetector CT imaging of the abdomen and pelvis was performed using the standard protocol following bolus administration of  intravenous contrast. CONTRAST:  1mL OMNIPAQUE IOHEXOL 300 MG/ML  SOLN COMPARISON:  None. FINDINGS: Lower chest: Elevated right hemidiaphragm with chronic atelectasis in the right lower and right middle lobe. Atherosclerosis of the thoracic aorta. The heart is normal in size. Hepatobiliary: Calcified granuloma in the right dome of the liver. No suspicious hepatic lesion. Gallbladder physiologically distended, no calcified stone. No biliary dilatation. Pancreas: No ductal dilatation or inflammation. Spleen: Normal in size without focal abnormality. Adrenals/Urinary Tract: Normal adrenal glands. No hydronephrosis. Homogeneous renal enhancement. Mild symmetric perinephric edema, nonspecific. No visualized renal calculi. Small cortical low-density in the lower left kidney is too small to accurately characterize, likely small cyst. Absent renal excretion on delayed phase imaging. Urinary bladder is minimally distended. No definite perivesicular fat stranding. Stomach/Bowel: Fluid/ingested material in the stomach. There is mild peri pyloric gastric wall thickening with minimal adjacent fat stranding, series 2, image 34. Duodenum is normally positioned. Decompressed small bowel without obstruction or inflammation. Normal appendix, no appendicitis. Liquid stool throughout the colon. There is no colonic wall thickening or pericolonic edema. Sigmoid diverticulosis without focal diverticulitis or colonic inflammation. Vascular/Lymphatic: Moderate aortic and branch atherosclerosis.  No aortic aneurysm. No acute vascular findings. Patent portal vein. No abdominopelvic adenopathy. Reproductive: Mildly enlarged prostate gland spanning 4.7 cm transverse causing mass effect on the bladder base. Other: Minimal fat in the inguinal canals. Trace fluid in the right inguinal canal. No ascites. No free air. No intra-abdominal abscess. Musculoskeletal: There are no acute or suspicious osseous abnormalities.   IMPRESSION:  1. Peri pyloric gastric wall thickening with minimal adjacent fat stranding, suspicious for gastritis or peptic ulcer disease.  2. Liquid stool throughout the colon, can be seen with diarrheal illness. No colonic wall thickening or pericolonic edema to suggest colitis.  3. Absent renal excretion on delayed phase imaging can be seen with renal dysfunction.  4. Mildly enlarged prostate gland causing mass effect on the bladder base.  5. Sigmoid diverticulosis without diverticulitis. Aortic Atherosclerosis.   (ICD10-I70.0). Electronically Signed   By: Narda Rutherford M.D.   On: 04/27/2020 22:09    EKG: Independently reviewed.  None     Assessment/Plan Sepsis (HCC) Pt meets sepsis criteria with fever/rr/ HR and suspect GI source, lactic and covid are pending. We will cont ivf hydration as he is hemoconcentrated  but his vitals are otherwise stabel. Blood pressure (!) 149/83, pulse (!) 102, temperature (!) 102.3 F (39.1 C), temperature source Oral, resp. rate 19, height 5\' 1"  (1.549 m), weight 63.5 kg, SpO2 93 %. Cultures have been collected in ED. Anti-infectives (From admission, onward)   Start     Dose/Rate Route Frequency Ordered Stop   04/27/20 2030  ciprofloxacin (CIPRO) IVPB 400 mg        400 mg 200 mL/hr over 60 Minutes Intravenous  Once 04/27/20 2019     04/27/20 2030  metroNIDAZOLE (FLAGYL) IVPB 500 mg        500 mg 100 mL/hr over 60 Minutes Intravenous  Once 04/27/20 2019        HTN (hypertension), benign No meds in chart we will start pt on  hydralazine until his sepsis status is stable and transition to Po meds.   Nausea vomiting and diarrhea D?/D include Covid gastroenteritis. We will admit to appropriate floor once covid is resulted.  Supportive care with iv ppi/ prn zofran/ and pepto bismol.   AKI (acute kidney injury) (HCC) Suspect pt has a certain degree of underlying CKD . We will follow and avoid  nephrotoxic meds or contrast and renally dose all meds.    Polycythemia Cont ivf hydration and baby asa on discharge. Recommend O/P hematology consult  If not done so yet.    Abnormal CT abdomen/pelvis: CT Result: 1. Peri pyloric gastric wall thickening with minimal adjacent fat stranding, suspicious for gastritis or peptic ulcer disease.- Obtain Gi Consult suspect PUD. 2. Liquid stool throughout the colon, can be seen with diarrheal illness. No colonic wall thickening or pericolonic edema to suggest colitis.  3. Absent renal excretion on delayed phase imaging can be seen with renal dysfunction.  4. Mildly enlarged prostate gland causing mass effect on the bladder base.  5. Sigmoid diverticulosis without diverticulitis. Aortic Atherosclerosis.    DVT prophylaxis:  Heparin  Code Status:  Full code   Family Communication:  None at bedside   Disposition Plan:  Home   Consults called:  None  Admission status:     Gertha Calkin MD [facility] Pager [phone] If 12am-12 pm , please contact  Admitting MD on Call from [facility]. [email or web address] Password [facility] 04/27/2020, 10:56 PM

## 2020-04-27 NOTE — Assessment & Plan Note (Signed)
Cont ivf hydration and baby asa on discharge. Recommend O/P hematology consult  If not done so yet.

## 2020-04-27 NOTE — Assessment & Plan Note (Signed)
Suspect pt has a certain degree of underlying CKD . We will follow and avoid nephrotoxic meds or contrast and renally dose all meds.

## 2020-04-27 NOTE — Assessment & Plan Note (Signed)
No meds in chart we will start pt on hydralazine until his sepsis status is stable and transition to Po meds.

## 2020-04-27 NOTE — Assessment & Plan Note (Signed)
D?D include Covid gastroenteritis. We will admit to appropriate floor once covid is resulted.  Supportive care with iv ppi/ prn zofran/ and pepto bismol.

## 2020-04-28 ENCOUNTER — Encounter: Payer: Self-pay | Admitting: Internal Medicine

## 2020-04-28 ENCOUNTER — Inpatient Hospital Stay: Payer: Medicare Other | Admitting: Certified Registered Nurse Anesthetist

## 2020-04-28 ENCOUNTER — Encounter
Admission: EM | Disposition: A | Discharge status: Home / Self Care | Payer: Self-pay | Source: Home / Self Care | Attending: Internal Medicine

## 2020-04-28 ENCOUNTER — Inpatient Hospital Stay: Payer: Medicare Other

## 2020-04-28 DIAGNOSIS — K921 Melena: Secondary | ICD-10-CM | POA: Diagnosis not present

## 2020-04-28 DIAGNOSIS — R197 Diarrhea, unspecified: Secondary | ICD-10-CM

## 2020-04-28 DIAGNOSIS — R112 Nausea with vomiting, unspecified: Secondary | ICD-10-CM

## 2020-04-28 HISTORY — PX: ESOPHAGOGASTRODUODENOSCOPY: SHX5428

## 2020-04-28 LAB — URINALYSIS, COMPLETE (UACMP) WITH MICROSCOPIC
Bacteria, UA: NONE SEEN
Bilirubin Urine: NEGATIVE
Glucose, UA: NEGATIVE mg/dL
Hgb urine dipstick: NEGATIVE
Ketones, ur: NEGATIVE mg/dL
Leukocytes,Ua: NEGATIVE
Nitrite: NEGATIVE
Protein, ur: NEGATIVE mg/dL
Specific Gravity, Urine: >1.046 — ABNORMAL HIGH (ref 1.005–1.030)
pH: 5 (ref 5.0–8.0)

## 2020-04-28 LAB — BASIC METABOLIC PANEL
Anion gap: 7 (ref 5–15)
BUN: 35 mg/dL — ABNORMAL HIGH (ref 8–23)
CO2: 26 mmol/L (ref 22–32)
Calcium: 8.2 mg/dL — ABNORMAL LOW (ref 8.9–10.3)
Chloride: 106 mmol/L (ref 98–111)
Creatinine, Ser: 1.69 mg/dL — ABNORMAL HIGH (ref 0.61–1.24)
GFR, Estimated: 39 mL/min — ABNORMAL LOW (ref >60)
Glucose, Bld: 97 mg/dL (ref 70–99)
Potassium: 4.2 mmol/L (ref 3.5–5.1)
Sodium: 139 mmol/L (ref 135–145)

## 2020-04-28 LAB — CBC
HCT: 40.7 % (ref 39.0–52.0)
HCT: 41.1 % (ref 39.0–52.0)
Hemoglobin: 13.5 g/dL (ref 13.0–17.0)
Hemoglobin: 13.6 g/dL (ref 13.0–17.0)
MCH: 28.5 pg (ref 26.0–34.0)
MCH: 28.4 pg (ref 26.0–34.0)
MCHC: 33.2 g/dL (ref 30.0–36.0)
MCHC: 33.1 g/dL (ref 30.0–36.0)
MCV: 86 fL (ref 80.0–100.0)
MCV: 85.8 fL (ref 80.0–100.0)
Platelets: 202 10*3/uL (ref 150–400)
Platelets: 195 10*3/uL (ref 150–400)
RBC: 4.73 MIL/uL (ref 4.22–5.81)
RBC: 4.79 MIL/uL (ref 4.22–5.81)
RDW: 13.9 % (ref 11.5–15.5)
RDW: 13.9 % (ref 11.5–15.5)
WBC: 9.5 10*3/uL (ref 4.0–10.5)
WBC: 8.9 10*3/uL (ref 4.0–10.5)
nRBC: 0 % (ref 0.0–0.2)
nRBC: 0 % (ref 0.0–0.2)

## 2020-04-28 LAB — GASTROINTESTINAL PANEL BY PCR, STOOL (REPLACES STOOL CULTURE)

## 2020-04-28 LAB — LACTIC ACID, PLASMA: Lactic Acid, Venous: 1.3 mmol/L (ref 0.5–1.9)

## 2020-04-28 LAB — C DIFFICILE QUICK SCREEN W PCR REFLEX
C Diff antigen: NEGATIVE
C Diff interpretation: NOT DETECTED
C Diff toxin: NEGATIVE

## 2020-04-28 LAB — OCCULT BLOOD X 1 CARD TO LAB, STOOL: Fecal Occult Bld: POSITIVE — AB

## 2020-04-28 LAB — CORTISOL-AM, BLOOD: Cortisol - AM: 15.8 ug/dL (ref 6.7–22.6)

## 2020-04-28 SURGERY — EGD (ESOPHAGOGASTRODUODENOSCOPY)
Anesthesia: General

## 2020-04-28 MED ORDER — LACTATED RINGERS IV SOLN: INTRAVENOUS | Status: DC

## 2020-04-28 MED ORDER — PANTOPRAZOLE SODIUM 40 MG IV SOLR
40.0000 mg | Freq: Two times a day (BID) | INTRAVENOUS | Status: DC
  Administered 2020-04-28 – 2020-04-29 (×2): 40 mg via INTRAVENOUS
  Filled 2020-04-28 (×3): qty 40

## 2020-04-28 MED ORDER — SODIUM CHLORIDE 0.9 % IV SOLN
INTRAVENOUS | Status: DC | PRN
  Administered 2020-04-28 (×2): 250 mL via INTRAVENOUS

## 2020-04-28 MED ORDER — PROPOFOL 10 MG/ML IV BOLUS
INTRAVENOUS | Status: DC | PRN
  Administered 2020-04-28: 50 mg via INTRAVENOUS

## 2020-04-28 MED ORDER — SODIUM CHLORIDE 0.9 % IV SOLN
INTRAVENOUS | Status: DC
  Administered 2020-04-28: 1000 mL via INTRAVENOUS

## 2020-04-28 MED ORDER — PHENYLEPHRINE HCL (PRESSORS) 10 MG/ML IV SOLN
INTRAVENOUS | Status: DC | PRN
  Administered 2020-04-28: 200 ug via INTRAVENOUS

## 2020-04-28 MED ORDER — INFLUENZA VAC A&B SA ADJ QUAD 0.5 ML IM PRSY
0.5000 mL | PREFILLED_SYRINGE | INTRAMUSCULAR | Status: DC
  Filled 2020-04-28: qty 0.5

## 2020-04-28 MED ORDER — SODIUM CHLORIDE 0.9% FLUSH
3.0000 mL | Freq: Two times a day (BID) | INTRAVENOUS | Status: DC
  Administered 2020-04-28 – 2020-04-29 (×4): 3 mL via INTRAVENOUS

## 2020-04-28 MED ORDER — PNEUMOCOCCAL VAC POLYVALENT 25 MCG/0.5ML IJ INJ
0.5000 mL | INJECTION | INTRAMUSCULAR | Status: AC
  Administered 2020-04-29: 0.5 mL via INTRAMUSCULAR
  Filled 2020-04-28: qty 0.5

## 2020-04-28 MED ORDER — PROPOFOL 500 MG/50ML IV EMUL
INTRAVENOUS | Status: DC | PRN
  Administered 2020-04-28: 135 ug/kg/min via INTRAVENOUS

## 2020-04-28 MED ORDER — LACTATED RINGERS IV BOLUS
500.0000 mL | Freq: Once | INTRAVENOUS | Status: AC
  Administered 2020-04-28: 500 mL via INTRAVENOUS

## 2020-04-28 MED ORDER — SODIUM CHLORIDE 0.9% FLUSH: 3.0000 mL | INTRAVENOUS | Status: DC | PRN

## 2020-04-28 MED ORDER — PROPOFOL 10 MG/ML IV BOLUS
INTRAVENOUS | Status: AC
  Filled 2020-04-28: qty 20

## 2020-04-28 MED ORDER — GLYCOPYRROLATE 0.2 MG/ML IJ SOLN
INTRAMUSCULAR | Status: AC
  Filled 2020-04-28: qty 1

## 2020-04-28 MED ORDER — LIDOCAINE HCL (CARDIAC) PF 100 MG/5ML IV SOSY
PREFILLED_SYRINGE | INTRAVENOUS | Status: DC | PRN
  Administered 2020-04-28: 100 mg via INTRAVENOUS

## 2020-04-28 NOTE — ED Notes (Signed)
Lactic acid redrawn, fluid bolus complete, patient has not voided for me from 3am till now for me to send UA.  Pt sent to room 233 via transport.

## 2020-04-28 NOTE — ED Notes (Signed)
Spoke to NP regarding pt's BP. See new orders/ MAR

## 2020-04-28 NOTE — Anesthesia Preprocedure Evaluation (Signed)
Anesthesia Evaluation  Patient identified by MRN, date of birth, ID band Patient awake    Reviewed: Allergy & Precautions, NPO status , Patient's Chart, lab work & pertinent test results  History of Anesthesia Complications Negative for: history of anesthetic complications  Airway Mallampati: III       Dental  (+) Upper Dentures, Lower Dentures   Pulmonary neg sleep apnea, neg COPD, Not current smoker,           Cardiovascular hypertension, Pt. on medications (-) Past MI and (-) CHF (-) dysrhythmias (-) Valvular Problems/Murmurs     Neuro/Psych neg Seizures    GI/Hepatic Neg liver ROS, neg GERD  ,  Endo/Other  neg diabetes  Renal/GU negative Renal ROS     Musculoskeletal   Abdominal   Peds  Hematology   Anesthesia Other Findings   Reproductive/Obstetrics                             Anesthesia Physical Anesthesia Plan  ASA: II  Anesthesia Plan: General   Post-op Pain Management:    Induction: Intravenous  PONV Risk Score and Plan: 2 and Propofol infusion and TIVA  Airway Management Planned: Nasal Cannula  Additional Equipment:   Intra-op Plan:   Post-operative Plan:   Informed Consent: I have reviewed the patients History and Physical, chart, labs and discussed the procedure including the risks, benefits and alternatives for the proposed anesthesia with the patient or authorized representative who has indicated his/her understanding and acceptance.       Plan Discussed with:   Anesthesia Plan Comments:         Anesthesia Quick Evaluation

## 2020-04-28 NOTE — Anesthesia Postprocedure Evaluation (Signed)
Anesthesia Post Note  Patient: Drew Sharp  Procedure(s) Performed: ESOPHAGOGASTRODUODENOSCOPY (EGD) (N/A )  Patient location during evaluation: Endoscopy Anesthesia Type: General Level of consciousness: awake and alert Pain management: pain level controlled Vital Signs Assessment: post-procedure vital signs reviewed and stable Respiratory status: spontaneous breathing, nonlabored ventilation, respiratory function stable and patient connected to nasal cannula oxygen Cardiovascular status: blood pressure returned to baseline and stable Postop Assessment: no apparent nausea or vomiting Anesthetic complications: no   No complications documented.   Last Vitals:  Vitals:   04/28/20 1808 04/28/20 2010  BP: (!) 121/57 (!) 145/58  Pulse:  81  Resp:  18  Temp:  37.1 C  SpO2:  94%    Last Pain:  Vitals:   04/28/20 2010  TempSrc: Oral  PainSc:                  Drew Sharp

## 2020-04-28 NOTE — Op Note (Signed)
[facility] Patient Name: Drew Sharp Procedure Date: 04/28/2020 5:37 PM MRN: [id number] Account #: [id number] Date of Birth: 1931/10/22 Admit Type: Inpatient Age: 86 Room: [facility] ENDO ROOM 4 Gender: Male Note Status: Finalized Procedure:             Upper GI endoscopy Indications:           [place] Providers:             Midge Minium MD, MD Medicines:             Propofol per Anesthesia Complications:         No immediate complications. Procedure:             Pre-Anesthesia Assessment:                        - Prior to the procedure, a History and Physical was                         performed, and patient medications and allergies were                         reviewed. The patient's tolerance of previous                         anesthesia was also reviewed. The risks and benefits                         of the procedure and the sedation options and risks                         were discussed with the patient. All questions were                         answered, and informed consent was obtained. Prior                         Anticoagulants: The patient has taken no previous                         anticoagulant or antiplatelet agents. ASA Grade                         Assessment: II - A patient with mild systemic disease.                         After reviewing the risks and benefits, the patient                         was deemed in satisfactory condition to undergo the                         procedure.                        After obtaining informed consent, the endoscope was                         passed under direct vision. Throughout the procedure,  the patient's blood pressure, pulse, and oxygen                         saturations were monitored continuously. The Endoscope                         was introduced through the mouth, and advanced to the                         second part of duodenum. The upper GI  endoscopy was                         accomplished without difficulty. The patient tolerated                         the procedure well. Findings:      The esophagus was normal.      Diffuse minimal inflammation characterized by erythema was found in the       entire examined stomach.      The examined duodenum was normal. Impression:            - Normal esophagus.                        - Gastritis.                        - Normal examined duodenum.                        - No specimens collected. Recommendation:        - Return patient to hospital ward for ongoing care.                        - Resume regular diet.                        - Continue present medications. Procedure Code(s):     --- Professional ---                        [phone], Esophagogastroduodenoscopy, flexible,                         transoral; diagnostic, including collection of                         specimen(s) by brushing or washing, when performed                         (separate procedure) Diagnosis Code(s):     --- Professional ---                        K92.1, Melena (includes Hematochezia)                        K29.70, Gastritis, unspecified, without bleeding CPT copyright 2019 American Medical Association. All rights reserved. The codes documented in this report are preliminary and upon coder review may  be revised to meet current compliance requirements. Midge Minium MD, MD 04/28/2020 5:56:29 PM This report has been signed electronically. Number of  Addenda: 0 Note Initiated On: 04/28/2020 5:37 PM Estimated Blood Loss:  Estimated blood loss: none.      [facility]

## 2020-04-28 NOTE — Progress Notes (Signed)
PROGRESS NOTE    Drew Sharp  PQZ:[id number] DOB: 25-Sep-1931 DOA: 04/27/2020 [facility]CP: [facility]   Brief Narrative: Taken from H&P. Drew Sharp is a 86 y.o. male with medical history significant of htn come to ed for N/V/D since yesterday night. Pt has not been able to keep anything down. Patient is a Spanish speaking gentleman. He was febrile, tachycardic and tachypneic with lactic acidosis and AKI on arrival. Started on ciprofloxacin and Flagyl for concern of abdominal infection.  Subjective: Patient is a Spanish-speaking guy, interpreter was used for conversation today. According to our conversation patient ate [facility] a day prior to start of his symptoms.  He had multiple episodes of nausea, vomiting and diarrhea.  Initially his diarrhea was yellowish-brown and this morning he had 2 episodes of black color stool.  Had another episode of large black color stool which was witnessed by nursing staff.  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  He was having dyspepsia and increased flatulence for some time and was evaluated by Dr. Alice Reichert last year.  History of hyperplastic polyp during prior colonoscopy.  No EGD done.  It was thought to be due to constipation. Patient had poor appetite for the past 2 days.  Denies any recent weight loss.  No recent travel.  Denies any urinary symptoms.  No sick contacts.  COVID-19 negative.  Denies any NSAID use. GI pathogen and C. difficile negative.  Assessment & Plan:   Principal Problem:   Sepsis ([place]) Active Problems:   HTN (hypertension), benign   Nausea vomiting and diarrhea   AKI (acute kidney injury) ([place])   Polycythemia  Nausea/vomiting/diarrhea/black tarry stools.  Per patient he had 2 episodes of black colors stools this morning and one witnessed episode in the afternoon.  Hemoglobin at 13.5 this morning, it was 18.5 on admission but  most likely hemoconcentrated secondary to GI losses.  It was 14.6 a year prior on care everywhere.  GI pathogen and C. difficile negative. CT abdomen with concern of gastritis/peptic ulcer disease.  Patient has some suspicious history with no documentation about a tumor removed from his stomach.  Denies any NSAID use but Mobic was listed on his home med list, no updated med record yet. -Repeat CBC. -FOBT -GI consult-Dr. Allen Norris will see him. -Keep him n.p.o. -Increase IV Protonix from daily to every 12 hourly. -Discontinue DVT prophylaxis.  Sepsis.  Ruled out as there is no obvious source of infection. Initially met SIRS criteria with fever, leukocytosis, tachycardia, lactic acidosis and AKI.  No obvious source of infection.  Severely concentrated blood secondary to dehydration.  Leukocytosis resolved with IV fluid.  Lactic acidosis resolved.  Blood cultures remain negative after 12-hour.  GI pathogen panel negative.  C. difficile negative.  UA does not look infected and urine cultures pending. Procalcitonin elevated at 2.50-can be elevated with severe physiologic stresses and malignancies. -Monitor procalcitonin. -Continue ciprofloxacin and Flagyl for now.  AKI.  Baseline creatinine appears to be around 1 according to lab results done a year before at care everywhere.  Creatinine started improving, it was 1.69 today. -We will obtain renal ultrasound. -Continue with gentle hydration. -Avoid nephrotoxins. -Continue to monitor.  Essential hypertension??  Blood pressure on softer side this morning. Irbesartan was listed on his med list, not sure whether he was taking it. -Continue to monitor. -Hold irbesartan secondary to AKI.   Objective: Vitals:   04/28/20  0430 04/28/20 0531 04/28/20 0842 04/28/20 1116  BP: 114/78 (!) 123/57 (!) 98/38 (!) 125/56  Pulse: 71 82 67 87  Resp: _0 Temp: 98.7 F (37.1 C) (!) 97.5 F (36.4 C) 98.6 F (37 C) 98.6 F (37 C)  TempSrc: Oral Oral Oral  Oral  SpO2: 95% 96% 95% 96%  Weight: 63.5 kg     Height: _1  (1.549 m)       Intake/Output Summary (Last 24 hours) at 04/28/2020 1407 Last data filed at 04/28/2020 1122 Gross per 24 hour  Intake 2885.05 ml  Output 425 ml  Net 2460.05 ml   Filed Weights   04/27/20 1827 04/28/20 0430  Weight: 63.5 kg 63.5 kg    Examination:  General exam: Appears calm and comfortable  Respiratory system: Clear to auscultation. Respiratory effort normal. Cardiovascular system: S1 & S2 heard, RRR.  Gastrointestinal system: Soft, epigastric tenderness, nondistended, bowel sounds positive. Central nervous system: Alert and oriented. No focal neurological deficits. Extremities: No edema, no cyanosis, pulses intact and symmetrical. Psychiatry: Judgement and insight appear normal.     DVT prophylaxis: SCDs Code Status: Full Family Communication: Discussed with patient. Disposition Plan:  Status is: Inpatient  Remains inpatient appropriate because:Inpatient level of care appropriate due to severity of illness   Dispo: The patient is from: Home              Anticipated d/c is to: Home              Anticipated d/c date is: 2 days              Patient currently is not medically stable to d/c.  Consultants:   GI  Procedures:  Antimicrobials:  Ciprofloxacin Flagyl  Data Reviewed: I have personally reviewed following labs and imaging studies  CBC: Recent Labs  Lab 04/27/20 1834 04/28/20 0844  WBC 19.5* 9.5  HGB 18.6* 13.5  HCT 55.5* 40.7  MCV 84.7 86.0  PLT 293 761   Basic Metabolic Panel: Recent Labs  Lab 04/27/20 1834 04/28/20 0844  NA 142 139  K 4.4 4.2  CL 101 106  CO2 25 26  GLUCOSE 136* 97  BUN 35* 35*  CREATININE 1.85* 1.69*  CALCIUM 10.2 8.2*   GFR: Estimated Creatinine Clearance: 25.2 mL/min (A) (by C-G formula based on SCr of 1.69 mg/dL (H)). Liver Function Tests: Recent Labs  Lab 04/27/20 1834  AST 34  ALT 20  ALKPHOS 107  BILITOT 1.6*  PROT 8.8*    ALBUMIN 4.9   Recent Labs  Lab 04/27/20 1834  LIPASE 28   No results for input(s): AMMONIA in the last 168 hours. Coagulation Profile: Recent Labs  Lab 04/27/20 2021  INR 1.0   Cardiac Enzymes: No results for input(s): CKTOTAL, CKMB, CKMBINDEX, TROPONINI in the last 168 hours. BNP (last 3 results) No results for input(s): PROBNP in the last 8760 hours. HbA1C: No results for input(s): HGBA1C in the last 72 hours. CBG: No results for input(s): GLUCAP in the last 168 hours. Lipid Profile: No results for input(s): CHOL, HDL, LDLCALC, TRIG, CHOLHDL, LDLDIRECT in the last 72 hours. Thyroid Function Tests: No results for input(s): TSH, T4TOTAL, FREET4, T3FREE, THYROIDAB in the last 72 hours. Anemia Panel: No results for input(s): VITAMINB12, FOLATE, FERRITIN, TIBC, IRON, RETICCTPCT in the last 72 hours. Sepsis Labs: Recent Labs  Lab 04/27/20 2021 04/27/20 2225 04/28/20 0504  PROCALCITON  --  2.50  --   LATICACIDVEN 2.7*   3.1*  --  1.3    Recent Results (from the past 240 hour(s))  Blood culture (routine x 2)     Status: None (Preliminary result)   Collection Time: 04/27/20  7:30 PM   Specimen: BLOOD  Result Value Ref Range Status   Specimen Description BLOOD BLOOD LEFT FOREARM  Final   Special Requests   Final    BOTTLES DRAWN AEROBIC AND ANAEROBIC Blood Culture adequate volume   Culture   Final    NO GROWTH < 12 HOURS Performed at [facility], [address] [place], [place] [address]    Report Status PENDING  Incomplete  Blood culture (routine x 2)     Status: None (Preliminary result)   Collection Time: 04/27/20  8:21 PM   Specimen: BLOOD  Result Value Ref Range Status   Specimen Description BLOOD BLOOD RIGHT FOREARM  Final   Special Requests   Final    BOTTLES DRAWN AEROBIC AND ANAEROBIC Blood Culture results may not be optimal due to an inadequate volume of blood received in culture bottles   Culture   Final    NO GROWTH < 12 HOURS Performed at  [facility], [address] [place], [place] [address]    Report Status PENDING  Incomplete  Respiratory Panel by RT PCR (Flu A&B, Covid) - Nasopharyngeal Swab     Status: None   Collection Time: 04/27/20  8:21 PM   Specimen: Nasopharyngeal Swab  Result Value Ref Range Status   SARS Coronavirus 2 by RT PCR NEGATIVE NEGATIVE Final    Comment: (NOTE) SARS-CoV-2 target nucleic acids are NOT DETECTED.  The SARS-CoV-2 RNA is generally detectable in upper respiratoy specimens during the acute phase of infection. The lowest concentration of SARS-CoV-2 viral copies this assay can detect is 131 copies/mL. A negative result does not preclude SARS-Cov-2 infection and should not be used as the sole basis for treatment or other patient management decisions. A negative result may occur with  improper specimen collection/handling, submission of specimen other than nasopharyngeal swab, presence of viral mutation(s) within the areas targeted by this assay, and inadequate number of viral copies (<131 copies/mL). A negative result must be combined with clinical observations, patient history, and epidemiological information. The expected result is Negative.  Fact Sheet for Patients:  PinkCheek.be  Fact Sheet for Healthcare Providers:  GravelBags.it  This test is no t yet approved or cleared by the [place] FDA and  has been authorized for detection and/or diagnosis of SARS-CoV-2 by FDA under an Emergency Use Authorization (EUA). This EUA will remain  in effect (meaning this test can be used) for the duration of the COVID-19 declaration under Section 564(b)(1) of the Act, 21 U.S.C. section 360bbb-3(b)(1), unless the authorization is terminated or revoked sooner.     Influenza A by PCR NEGATIVE NEGATIVE Final   Influenza B by PCR NEGATIVE NEGATIVE Final    Comment: (NOTE) The Xpert Xpress SARS-CoV-2/FLU/RSV assay is  intended as an aid in  the diagnosis of influenza from Nasopharyngeal swab specimens and  should not be used as a sole basis for treatment. Nasal washings and  aspirates are unacceptable for Xpert Xpress SARS-CoV-2/FLU/RSV  testing.  Fact Sheet for Patients: PinkCheek.be  Fact Sheet for Healthcare Providers: GravelBags.it  This test is not yet approved or cleared by the [place] FDA and  has been authorized for detection and/or diagnosis of SARS-CoV-2 by  FDA under an Emergency Use Authorization (EUA). This EUA will remain  in effect (meaning this  test can be used) for the duration of the  Covid-19 declaration under Section 564(b)(1) of the Act, 21  U.S.C. section 360bbb-3(b)(1), unless the authorization is  terminated or revoked. Performed at [facility], [address] [place], [place] [address]   Gastrointestinal Panel by PCR , Stool     Status: None   Collection Time: 04/28/20  5:42 AM   Specimen: Stool  Result Value Ref Range Status   Campylobacter species NOT DETECTED NOT DETECTED Final   Plesimonas shigelloides NOT DETECTED NOT DETECTED Final   Salmonella species NOT DETECTED NOT DETECTED Final   Yersinia enterocolitica NOT DETECTED NOT DETECTED Final   Vibrio species NOT DETECTED NOT DETECTED Final   Vibrio cholerae NOT DETECTED NOT DETECTED Final   Enteroaggregative E coli (EAEC) NOT DETECTED NOT DETECTED Final   Enteropathogenic E coli (EPEC) NOT DETECTED NOT DETECTED Final   Enterotoxigenic E coli (ETEC) NOT DETECTED NOT DETECTED Final   Shiga like toxin producing E coli (STEC) NOT DETECTED NOT DETECTED Final   Shigella/Enteroinvasive E coli (EIEC) NOT DETECTED NOT DETECTED Final   Cryptosporidium NOT DETECTED NOT DETECTED Final   Cyclospora cayetanensis NOT DETECTED NOT DETECTED Final   Entamoeba histolytica NOT DETECTED NOT DETECTED Final   Giardia lamblia NOT DETECTED NOT DETECTED Final    Adenovirus F40/41 NOT DETECTED NOT DETECTED Final   Astrovirus NOT DETECTED NOT DETECTED Final   Norovirus GI/GII NOT DETECTED NOT DETECTED Final   Rotavirus A NOT DETECTED NOT DETECTED Final   Sapovirus (I, II, IV, and V) NOT DETECTED NOT DETECTED Final    Comment: Performed at [facility], [address] [place], [place] [address]  C Difficile Quick Screen w PCR reflex     Status: None   Collection Time: 04/28/20  5:42 AM   Specimen: STOOL  Result Value Ref Range Status   C Diff antigen NEGATIVE NEGATIVE Final   C Diff toxin NEGATIVE NEGATIVE Final   C Diff interpretation No C. difficile detected.  Final    Comment: Performed at [facility], [address] [place], [place] [address]     Radiology Studies: CT ABDOMEN PELVIS W CONTRAST  Result Date: 04/27/2020 CLINICAL DATA:  86 year old with abdominal pain and fever. Diarrhea and vomiting. EXAM: CT ABDOMEN AND PELVIS WITH CONTRAST TECHNIQUE: Multidetector CT imaging of the abdomen and pelvis was performed using the standard protocol following bolus administration of intravenous contrast. CONTRAST:  50m OMNIPAQUE IOHEXOL 300 MG/ML  SOLN COMPARISON:  None. FINDINGS: Lower chest: Elevated right hemidiaphragm with chronic atelectasis in the right lower and right middle lobe. Atherosclerosis of the thoracic aorta. The heart is normal in size. Hepatobiliary: Calcified granuloma in the right dome of the liver. No suspicious hepatic lesion. Gallbladder physiologically distended, no calcified stone. No biliary dilatation. Pancreas: No ductal dilatation or inflammation. Spleen: Normal in size without focal abnormality. Adrenals/Urinary Tract: Normal adrenal glands. No hydronephrosis. Homogeneous renal enhancement. Mild symmetric perinephric edema, nonspecific. No visualized renal calculi. Small cortical low-density in the lower left kidney is too small to accurately characterize, likely small cyst. Absent renal excretion on delayed  phase imaging. Urinary bladder is minimally distended. No definite perivesicular fat stranding. Stomach/Bowel: Fluid/ingested material in the stomach. There is mild peri pyloric gastric wall thickening with minimal adjacent fat stranding, series 2, image 34. Duodenum is normally positioned. Decompressed small bowel without obstruction or inflammation. Normal appendix, no appendicitis. Liquid stool throughout the colon. There is no colonic wall thickening or pericolonic edema. Sigmoid  diverticulosis without focal diverticulitis or colonic inflammation. Vascular/Lymphatic: Moderate aortic and branch atherosclerosis. No aortic aneurysm. No acute vascular findings. Patent portal vein. No abdominopelvic adenopathy. Reproductive: Mildly enlarged prostate gland spanning 4.7 cm transverse causing mass effect on the bladder base. Other: Minimal fat in the inguinal canals. Trace fluid in the right inguinal canal. No ascites. No free air. No intra-abdominal abscess. Musculoskeletal: There are no acute or suspicious osseous abnormalities. IMPRESSION: 1. Peri pyloric gastric wall thickening with minimal adjacent fat stranding, suspicious for gastritis or peptic ulcer disease. 2. Liquid stool throughout the colon, can be seen with diarrheal illness. No colonic wall thickening or pericolonic edema to suggest colitis. 3. Absent renal excretion on delayed phase imaging can be seen with renal dysfunction. 4. Mildly enlarged prostate gland causing mass effect on the bladder base. 5. Sigmoid diverticulosis without diverticulitis. Aortic Atherosclerosis (ICD10-I70.0). Electronically Signed   By: Keith Rake M.D.   On: 04/27/2020 22:09    Scheduled Meds:  heparin  5,000 Units Subcutaneous Q8H   [START ON 04/29/2020] influenza vaccine adjuvanted  0.5 mL Intramuscular Tomorrow-1000   pantoprazole (PROTONIX) IV  40 mg Intravenous Q24H   [START ON 04/29/2020] pneumococcal 23 valent vaccine  0.5 mL Intramuscular Tomorrow-1000     sodium chloride flush  3 mL Intravenous Q12H   thiamine injection  100 mg Intravenous Daily   Continuous Infusions:  sodium chloride 250 mL (04/28/20 0615)   ceFEPime (MAXIPIME) IV     metronidazole 500 mg (04/28/20 0617)     LOS: 1 day   Time spent: 35 minutes.  Lorella Nimrod, MD [facility]  If 7PM-7AM, please contact night-coverage [email or web address]  04/28/2020, 2:07 PM   This record has been created using Systems analyst. Errors have been sought and corrected,but may not always be located. Such creation errors do not reflect on the standard of care.

## 2020-04-28 NOTE — Progress Notes (Signed)
Consent obtained through interpreter to discuss plan of care with grandson Alecia Lemming over telephone.Marland Kitchen

## 2020-04-28 NOTE — Consult Note (Signed)
Midge Minium, MD [facility]  [address] [place], [place] [address] Phone: [phone] Fax : [phone]  Consultation  Referring Provider:     Dr. Allena Katz Primary Care Physician:  [facility] Primary Gastroenterologist: Dr. Norma Fredrickson Reason for Consultation:     Melena  Date of Admission:  04/27/2020 Date of Consultation:  04/28/2020         HPI:   Drew Sharp is a 86 y.o. male who was admitted with nausea vomiting and diarrhea.  The patient had come to the ED for the above complaints states that his stools were yellow.  The patient started to have black stools today.  He denies any further nausea vomiting.  The patient's history is gotten through a interpreter since he speaks only [place].  The patient was presumed to have gastroenteritis.  He was treated with a PPI as needed Zofran and Pepto-Bismol. The patient had reported eating [place] fried chicken a day prior to having his symptoms.  The patient had 2 episodes of black stools this morning and then had a large black stool which was witnessed by the nursing staff.  The patient thinks it is caused by the medications he has been given.  The patient did have some dyspepsia and was evaluated by Dr. Norma Fredrickson last year.  The patient was reported at that time to have had a colonoscopy within the last 10 years and was told to drink more water and Benefiber due to his constipation.  Past Medical History:  Diagnosis Date  . Hypertension     History reviewed. No pertinent surgical history.  Prior to Admission medications   Medication Sig Start Date End Date Taking? Authorizing Provider  acetaminophen (TYLENOL) 500 MG tablet Take 1,000 mg by mouth as needed for pain.   Yes [provider]  chlorthalidone (HYGROTON) 25 MG tablet Take 25 mg by mouth daily. 12/06/18  Yes [provider]  irbesartan (AVAPRO) 150 MG tablet Take 150 mg by mouth daily. 01/03/19  Yes [provider]  meloxicam  (MOBIC) 15 MG tablet Take 15 mg by mouth daily. Take with food. 04/27/20  Yes [provider]  albuterol (PROVENTIL HFA;VENTOLIN HFA) 108 (90 Base) MCG/ACT inhaler Inhale 2 puffs into the lungs every 6 (six) hours as needed for wheezing or shortness of breath. 07/30/16   Arnaldo Natal, MD  azithromycin (ZITHROMAX) 250 MG tablet Take 250 mg by mouth See admin instructions. Take 2 tablets on day 1, then take 1 tablet daily for days 2-5. 04/27/20 05/01/20  [provider]  cyclobenzaprine (FLEXERIL) 10 MG tablet Take 10 mg by mouth in the morning and at bedtime.    [provider]    History reviewed. No pertinent family history.   Social History   Tobacco Use  . Smoking status: Never Smoker  . Smokeless tobacco: Never Used  Substance Use Topics  . Alcohol use: Yes    Alcohol/week: 1.0 standard drink    Types: 1 Shots of liquor per week    Comment: shot of tequila daily  . Drug use: No    Allergies as of 04/27/2020  . (No Known Allergies)    Review of Systems:    All systems reviewed and negative except where noted in HPI.   Physical Exam:  Vital signs in last 24 hours: Temp:  [97.5 F (36.4 C)-102.3 F (39.1 C)] 98.6 F (37 C) (11/02 1116) Pulse Rate:  [67-112] 87 (11/02 1116) Resp:  [15-37] 18 (  11/02 1116) BP: (98-154)/(37-96) 125/56 (11/02 1116) SpO2:  [86 %-96 %] 96 % (11/02 1116) Weight:  [63.5 kg] 63.5 kg (11/02 0430) Last BM Date: 04/28/20 General:   Pleasant, cooperative in NAD Head:  Normocephalic and atraumatic. Eyes:   No icterus.   Conjunctiva pink. PERRLA. Ears:  Normal auditory acuity. Neck:  Supple; no masses or thyroidomegaly Lungs: Respirations even and unlabored. Lungs clear to auscultation bilaterally.   No wheezes, crackles, or rhonchi.  Heart:  Regular rate and rhythm;  Without murmur, clicks, rubs or gallops Abdomen:  Soft, nondistended, nontender. Normal bowel sounds. No appreciable masses or hepatomegaly.  No rebound or  guarding.  Rectal:  Not performed. Msk:  Symmetrical without gross deformities.    Extremities:  Without edema, cyanosis or clubbing. Neurologic:  Alert and oriented x3;  grossly normal neurologically. Skin:  Intact without significant lesions or rashes. Cervical Nodes:  No significant cervical adenopathy. Psych:  Alert and cooperative. Normal affect.  LAB RESULTS: Recent Labs    04/27/20 1834 04/28/20 0844 04/28/20 1435  WBC 19.5* 9.5 8.9  HGB 18.6* 13.5 13.6  HCT 55.5* 40.7 41.1  PLT 293 202 195   BMET Recent Labs    04/27/20 1834 04/28/20 0844  NA 142 139  K 4.4 4.2  CL 101 106  CO2 25 26  GLUCOSE 136* 97  BUN 35* 35*  CREATININE 1.85* 1.69*  CALCIUM 10.2 8.2*   LFT Recent Labs    04/27/20 1834  PROT 8.8*  ALBUMIN 4.9  AST 34  ALT 20  ALKPHOS 107  BILITOT 1.6*   PT/INR Recent Labs    04/27/20 2021  LABPROT 13.0  INR 1.0    STUDIES: CT ABDOMEN PELVIS W CONTRAST  Result Date: 04/27/2020 CLINICAL DATA:  86 year old with abdominal pain and fever. Diarrhea and vomiting. EXAM: CT ABDOMEN AND PELVIS WITH CONTRAST TECHNIQUE: Multidetector CT imaging of the abdomen and pelvis was performed using the standard protocol following bolus administration of intravenous contrast. CONTRAST:  28mL OMNIPAQUE IOHEXOL 300 MG/ML  SOLN COMPARISON:  None. FINDINGS: Lower chest: Elevated right hemidiaphragm with chronic atelectasis in the right lower and right middle lobe. Atherosclerosis of the thoracic aorta. The heart is normal in size. Hepatobiliary: Calcified granuloma in the right dome of the liver. No suspicious hepatic lesion. Gallbladder physiologically distended, no calcified stone. No biliary dilatation. Pancreas: No ductal dilatation or inflammation. Spleen: Normal in size without focal abnormality. Adrenals/Urinary Tract: Normal adrenal glands. No hydronephrosis. Homogeneous renal enhancement. Mild symmetric perinephric edema, nonspecific. No visualized renal calculi.  Small cortical low-density in the lower left kidney is too small to accurately characterize, likely small cyst. Absent renal excretion on delayed phase imaging. Urinary bladder is minimally distended. No definite perivesicular fat stranding. Stomach/Bowel: Fluid/ingested material in the stomach. There is mild peri pyloric gastric wall thickening with minimal adjacent fat stranding, series 2, image 34. Duodenum is normally positioned. Decompressed small bowel without obstruction or inflammation. Normal appendix, no appendicitis. Liquid stool throughout the colon. There is no colonic wall thickening or pericolonic edema. Sigmoid diverticulosis without focal diverticulitis or colonic inflammation. Vascular/Lymphatic: Moderate aortic and branch atherosclerosis. No aortic aneurysm. No acute vascular findings. Patent portal vein. No abdominopelvic adenopathy. Reproductive: Mildly enlarged prostate gland spanning 4.7 cm transverse causing mass effect on the bladder base. Other: Minimal fat in the inguinal canals. Trace fluid in the right inguinal canal. No ascites. No free air. No intra-abdominal abscess. Musculoskeletal: There are no acute or suspicious osseous abnormalities. IMPRESSION: 1. Peri pyloric  gastric wall thickening with minimal adjacent fat stranding, suspicious for gastritis or peptic ulcer disease. 2. Liquid stool throughout the colon, can be seen with diarrheal illness. No colonic wall thickening or pericolonic edema to suggest colitis. 3. Absent renal excretion on delayed phase imaging can be seen with renal dysfunction. 4. Mildly enlarged prostate gland causing mass effect on the bladder base. 5. Sigmoid diverticulosis without diverticulitis. Aortic Atherosclerosis (ICD10-I70.0). Electronically Signed   By: Narda Rutherford M.D.   On: 04/27/2020 22:09      Impression / Plan:   Assessment: Principal Problem:   Sepsis (HCC) Active Problems:   HTN (hypertension), benign   Nausea vomiting and  diarrhea   AKI (acute kidney injury) (HCC)   Polycythemia   Drew Sharp is a 86 y.o. y/o male with black stools and a drop in his hemoglobin although his hemoglobin is normal he came in with a hemoglobin of 18.6 that went down to 13.5.  The patient also had a white cell count on admission of 19.5.  Plan:  The patient has been explained through an interpreter that the black stools can be caused by a GI bleed in addition to the fact that he reports some epigastric discomfort.  The patient will be brought to the endoscopy unit today and have an EGD to rule out peptic ulcer disease or any sign of an upper GI bleed as a cause of his black stools.  The patient has been explained the plan agrees with it.  Thank you for involving me in the care of this patient.      LOS: 1 day   Midge Minium, MD, [facility] 04/28/2020, 3:34 PM,  Pager [phone] 7am-5pm  Check AMION for 5pm -7am coverage and on weekends   Note: This dictation was prepared with Dragon dictation along with smaller phrase technology. Any transcriptional errors that result from this process are unintentional.

## 2020-04-28 NOTE — Transfer of Care (Signed)
Immediate Anesthesia Transfer of Care Note  Patient: Yonathan Wirsing  Procedure(s) Performed: ESOPHAGOGASTRODUODENOSCOPY (EGD) (N/A )  Patient Location: Endoscopy Unit  Anesthesia Type:General  Level of Consciousness: awake, drowsy and patient cooperative  Airway & Oxygen Therapy: Patient Spontanous Breathing and Patient connected to face mask oxygen  Post-op Assessment: Report given to RN and Post -op Vital signs reviewed and stable  Post vital signs: Reviewed and stable  Last Vitals:  Vitals Value Taken Time  BP 124/49 04/28/20 1759  Temp 36.7 C 04/28/20 1758  Pulse 73 04/28/20 1800  Resp 22 04/28/20 1800  SpO2 100 % 04/28/20 1800  Vitals shown include unvalidated device data.  Last Pain:  Vitals:   04/28/20 1758  TempSrc: Temporal  PainSc: 0-No pain      Patients Stated Pain Goal: 0 (04/28/20 1613)  Complications: No complications documented.

## 2020-04-29 ENCOUNTER — Encounter: Payer: Self-pay | Admitting: Gastroenterology

## 2020-04-29 DIAGNOSIS — E860 Dehydration: Principal | ICD-10-CM

## 2020-04-29 LAB — CBC
HCT: 38.9 % — ABNORMAL LOW (ref 39.0–52.0)
Hemoglobin: 13 g/dL (ref 13.0–17.0)
MCH: 28.6 pg (ref 26.0–34.0)
MCHC: 33.4 g/dL (ref 30.0–36.0)
MCV: 85.7 fL (ref 80.0–100.0)
Platelets: 187 10*3/uL (ref 150–400)
RBC: 4.54 MIL/uL (ref 4.22–5.81)
RDW: 14 % (ref 11.5–15.5)
WBC: 7.6 10*3/uL (ref 4.0–10.5)
nRBC: 0 % (ref 0.0–0.2)

## 2020-04-29 LAB — URINE CULTURE: Culture: NO GROWTH

## 2020-04-29 LAB — BASIC METABOLIC PANEL WITH GFR
Anion gap: 9 (ref 5–15)
BUN: 22 mg/dL (ref 8–23)
CO2: 23 mmol/L (ref 22–32)
Calcium: 8 mg/dL — ABNORMAL LOW (ref 8.9–10.3)
Chloride: 108 mmol/L (ref 98–111)
Creatinine, Ser: 1.2 mg/dL (ref 0.61–1.24)
GFR, Estimated: 59 mL/min — ABNORMAL LOW
Glucose, Bld: 80 mg/dL (ref 70–99)
Potassium: 3.4 mmol/L — ABNORMAL LOW (ref 3.5–5.1)
Sodium: 140 mmol/L (ref 135–145)

## 2020-04-29 MED ORDER — CIPROFLOXACIN HCL 500 MG PO TABS: 500.0000 mg | ORAL_TABLET | Freq: Two times a day (BID) | ORAL | 0 refills | Status: AC

## 2020-04-29 MED ORDER — OMEPRAZOLE 40 MG PO CPDR: 40.0000 mg | DELAYED_RELEASE_CAPSULE | Freq: Every day | ORAL | 1 refills | Status: AC

## 2020-04-29 MED ORDER — SODIUM CHLORIDE 0.9 % IV SOLN
2.0000 g | Freq: Two times a day (BID) | INTRAVENOUS | Status: DC
  Filled 2020-04-29: qty 2

## 2020-04-29 MED ORDER — POTASSIUM CHLORIDE CRYS ER 20 MEQ PO TBCR
40.0000 meq | EXTENDED_RELEASE_TABLET | Freq: Once | ORAL | Status: AC
  Administered 2020-04-29: 40 meq via ORAL
  Filled 2020-04-29: qty 2

## 2020-04-29 NOTE — Consult Note (Signed)
PHARMACY NOTE:  ANTIMICROBIAL RENAL DOSAGE ADJUSTMENT  Current antimicrobial regimen includes a mismatch between antimicrobial dosage and estimated renal function.  As per policy approved by the Pharmacy & Therapeutics and Medical Executive Committees, the antimicrobial dosage will be adjusted accordingly.  Current antimicrobial dosage:  Cefepime 2 g q24H   Indication: sepsis  Renal Function:  Estimated Creatinine Clearance: 35.5 mL/min (by C-G formula based on SCr of 1.2 mg/dL). []      On intermittent HD, scheduled: []      On CRRT    Antimicrobial dosage has been changed to:  Cefepime 2 g q12H.     Thank you for allowing pharmacy to be a part of this patient's care.  , [facility] 04/29/2020 10:28 AM

## 2020-04-29 NOTE — Discharge Summary (Signed)
Physician Discharge Summary  Drew Sharp AST:[id number] DOB: [birth year] DOA: 04/27/2020  PCP: [facility]  Admit date: 04/27/2020 Discharge date: 04/29/2020  Admitted From: Home Disposition: Home  Recommendations for Outpatient Follow-up:  1. Follow up with PCP in 1-2 weeks 2. Please obtain BMP/CBC in one week 3. Please follow up on the following pending results: None  Home Health: No Equipment/Devices: None Discharge Condition: Stable CODE STATUS: Full Diet recommendation: Heart Healthy  Brief/Interim Summary: Drew Sharp a 86 y.o.malewith medical history significant ofhypertension come to ed for N/V/D.  He was febrile, tachycardic and tachypneic with lactic acidosis and AKI on arrival.  He was started on cefepime and Flagyl for concern of sepsis.  Sepsis ruled out as there was no obvious source of infection.  Initial blood draw appears very hemoconcentrated as leukocytosis resolved pretty quickly.  UA does not look infected.  Urine and blood cultures negative.  C. difficile was negative.  GI pathogen panel was negative.  CT abdomen without any concern of infection, mild gastritis/PUD. Patient ate [employer] a day prior to his symptoms.  Most likely some element of food poisoning.  Patient developed black color stools concerning for melena, stool occult cards were positive.  GI was consulted and he was taken for EGD.  It was negative for any obvious bleeding but did shows diffuse gastritis. Hemoglobin remained stable.  His symptoms improved before discharge. He was discharged with a 3 more days of Cipro and PPI.  Patient did had AKI most likely prerenal secondary to dehydration.  Creatinine improved with IV hydration.  He was instructed to keep up with p.o. hydration on discharge.  Renal ultrasound obtained and it was within normal limit.  Did show a very small left renal cyst without any concerning features. His home dose of irbesartan was initially held due  to AKI.  He can resume on discharge and will follow up with his primary care provider.  Patient will need a repeat FOBT testing after resolution of gastritis symptoms. If remain positive he will get benefit with repeat colonoscopy.  Patient is a Spanish-speaking gentleman, all the communication was done with the help of an interpreter.  Discharge Diagnoses:  Principal Problem:   Sepsis (HCC) Active Problems:   HTN (hypertension), benign   Nausea vomiting and diarrhea   AKI (acute kidney injury) (HCC)   Polycythemia   Melena   Dehydration  Discharge Instructions  Discharge Instructions    Diet - low sodium heart healthy   Complete by: As directed    Discharge instructions   Complete by: As directed    Taking care of you. Continue taking your medications as directed and follow-up with your primary care provider. Keep yourself well-hydrated.   Increase activity slowly   Complete by: As directed      Allergies as of 04/29/2020   No Known Allergies     Medication List    STOP taking these medications   azithromycin 250 MG tablet Commonly known as: ZITHROMAX   Mobic 15 MG tablet Generic drug: meloxicam     TAKE these medications   acetaminophen 500 MG tablet Commonly known as: TYLENOL Take 1,000 mg by mouth as needed for pain.   albuterol 108 (90 Base) MCG/ACT inhaler Commonly known as: VENTOLIN HFA Inhale 2 puffs into the lungs every 6 (six) hours as needed for wheezing or shortness of breath.   chlorthalidone 25 MG tablet Commonly known as: HYGROTON Take 25 mg by mouth daily.   ciprofloxacin 500  MG tablet Commonly known as: Cipro Take 1 tablet (500 mg total) by mouth 2 (two) times daily for 3 days.   cyclobenzaprine 10 MG tablet Commonly known as: FLEXERIL Take 10 mg by mouth in the morning and at bedtime.   irbesartan 150 MG tablet Commonly known as: AVAPRO Take 150 mg by mouth daily.   omeprazole 40 MG capsule Commonly known as: PRILOSEC Take 1  capsule (40 mg total) by mouth daily.       Follow-up Information    [facility]. Schedule an appointment as soon as possible for a visit in 1 week(s).   Specialty: General Practice Contact information: [address] [place] [address] [phone]              No Known Allergies  Consultations:  GI  Procedures/Studies: CT ABDOMEN PELVIS W CONTRAST  Result Date: 04/27/2020 CLINICAL DATA:  86 year old with abdominal pain and fever. Diarrhea and vomiting. EXAM: CT ABDOMEN AND PELVIS WITH CONTRAST TECHNIQUE: Multidetector CT imaging of the abdomen and pelvis was performed using the standard protocol following bolus administration of intravenous contrast. CONTRAST:  75mL OMNIPAQUE IOHEXOL 300 MG/ML  SOLN COMPARISON:  None. FINDINGS: Lower chest: Elevated right hemidiaphragm with chronic atelectasis in the right lower and right middle lobe. Atherosclerosis of the thoracic aorta. The heart is normal in size. Hepatobiliary: Calcified granuloma in the right dome of the liver. No suspicious hepatic lesion. Gallbladder physiologically distended, no calcified stone. No biliary dilatation. Pancreas: No ductal dilatation or inflammation. Spleen: Normal in size without focal abnormality. Adrenals/Urinary Tract: Normal adrenal glands. No hydronephrosis. Homogeneous renal enhancement. Mild symmetric perinephric edema, nonspecific. No visualized renal calculi. Small cortical low-density in the lower left kidney is too small to accurately characterize, likely small cyst. Absent renal excretion on delayed phase imaging. Urinary bladder is minimally distended. No definite perivesicular fat stranding. Stomach/Bowel: Fluid/ingested material in the stomach. There is mild peri pyloric gastric wall thickening with minimal adjacent fat stranding, series 2, image 34. Duodenum is normally positioned. Decompressed small bowel without obstruction or inflammation. Normal  appendix, no appendicitis. Liquid stool throughout the colon. There is no colonic wall thickening or pericolonic edema. Sigmoid diverticulosis without focal diverticulitis or colonic inflammation. Vascular/Lymphatic: Moderate aortic and branch atherosclerosis. No aortic aneurysm. No acute vascular findings. Patent portal vein. No abdominopelvic adenopathy. Reproductive: Mildly enlarged prostate gland spanning 4.7 cm transverse causing mass effect on the bladder base. Other: Minimal fat in the inguinal canals. Trace fluid in the right inguinal canal. No ascites. No free air. No intra-abdominal abscess. Musculoskeletal: There are no acute or suspicious osseous abnormalities. IMPRESSION: 1. Peri pyloric gastric wall thickening with minimal adjacent fat stranding, suspicious for gastritis or peptic ulcer disease. 2. Liquid stool throughout the colon, can be seen with diarrheal illness. No colonic wall thickening or pericolonic edema to suggest colitis. 3. Absent renal excretion on delayed phase imaging can be seen with renal dysfunction. 4. Mildly enlarged prostate gland causing mass effect on the bladder base. 5. Sigmoid diverticulosis without diverticulitis. Aortic Atherosclerosis (ICD10-I70.0). Electronically Signed   By: Narda Rutherford M.D.   On: 04/27/2020 22:09   US RENAL  Result Date: 04/28/2020 CLINICAL DATA:  Acute renal injury EXAM: RENAL / URINARY TRACT ULTRASOUND COMPLETE COMPARISON:  CT from the previous day. FINDINGS: Right Kidney: Renal measurements: 8.6 x 4.4 x 3.9 cm. = volume: 78 mL. Echogenicity within normal limits. No mass or hydronephrosis visualized. Left Kidney: Renal measurements: 10.1 x 4.7 x 3.6  cm = volume: 88 mL. 8 mm cyst is noted in the midportion of the left kidney. This corresponds to the lesions seen on prior CT examination. Bladder: Appears normal for degree of bladder distention. Other: None. IMPRESSION: Tiny left renal cyst. No other focal abnormality is noted. Electronically  Signed   By: Alcide Clever M.D.   On: 04/28/2020 16:41     Subjective: Patient was feeling better when seen during morning rounds.  No nausea or vomiting.  Able to tolerate breakfast without any difficulty.  Had 1 bowel movement since morning, stating that it is not that loose now.  Denies any more melena.  Discharge Exam: Vitals:   04/29/20 0358 04/29/20 0812  BP: (!) 110/44 (!) 133/58  Pulse: 70 71  Resp: 17 18  Temp: 98.5 F (36.9 C) 98.2 F (36.8 C)  SpO2: 93% 93%   Vitals:   04/28/20 1808 04/28/20 2010 04/29/20 0358 04/29/20 0812  BP: (!) 121/57 (!) 145/58 (!) 110/44 (!) 133/58  Pulse:  81 70 71  Resp:  Temp:  98.7 F (37.1 C) 98.5 F (36.9 C) 98.2 F (36.8 C)  TempSrc:  Oral Oral Oral  SpO2:  94% 93% 93%  Weight:      Height:        General: Pt is alert, awake, not in acute distress Cardiovascular: RRR, S1/S2 +, no rubs, no gallops Respiratory: CTA bilaterally, no wheezing, no rhonchi Abdominal: Soft, NT, ND, bowel sounds + Extremities: no edema, no cyanosis   The results of significant diagnostics from this hospitalization (including imaging, microbiology, ancillary and laboratory) are listed below for reference.    Microbiology: Recent Results (from the past 240 hour(s))  Blood culture (routine x 2)     Status: None (Preliminary result)   Collection Time: 04/27/20  7:30 PM   Specimen: BLOOD  Result Value Ref Range Status   Specimen Description BLOOD BLOOD LEFT FOREARM  Final   Special Requests   Final    BOTTLES DRAWN AEROBIC AND ANAEROBIC Blood Culture adequate volume   Culture   Final    NO GROWTH 2 DAYS Performed at [facility], [address], [place], [place] [address]    Report Status PENDING  Incomplete  Blood culture (routine x 2)     Status: None (Preliminary result)   Collection Time: 04/27/20  8:21 PM   Specimen: BLOOD  Result Value Ref Range Status   Specimen Description BLOOD BLOOD RIGHT FOREARM  Final   Special  Requests   Final    BOTTLES DRAWN AEROBIC AND ANAEROBIC Blood Culture results may not be optimal due to an inadequate volume of blood received in culture bottles   Culture   Final    NO GROWTH 2 DAYS Performed at [facility], [address] [place], [place] [address]    Report Status PENDING  Incomplete  Respiratory Panel by RT PCR (Flu A&B, Covid) - Nasopharyngeal Swab     Status: None   Collection Time: 04/27/20  8:21 PM   Specimen: Nasopharyngeal Swab  Result Value Ref Range Status   SARS Coronavirus 2 by RT PCR NEGATIVE NEGATIVE Final    Comment: (NOTE) SARS-CoV-2 target nucleic acids are NOT DETECTED.  The SARS-CoV-2 RNA is generally detectable in upper respiratoy specimens during the acute phase of infection. The lowest concentration of SARS-CoV-2 viral copies this assay can detect is 131 copies/mL. A negative result does not preclude SARS-Cov-2 infection and should not be used  as the sole basis for treatment or other patient management decisions. A negative result may occur with  improper specimen collection/handling, submission of specimen other than nasopharyngeal swab, presence of viral mutation(s) within the areas targeted by this assay, and inadequate number of viral copies (<131 copies/mL). A negative result must be combined with clinical observations, patient history, and epidemiological information. The expected result is Negative.  Fact Sheet for Patients:  https://www.moore.com/  Fact Sheet for Healthcare Providers:  https://www.young.biz/  This test is no t yet approved or cleared by the [place] FDA and  has been authorized for detection and/or diagnosis of SARS-CoV-2 by FDA under an Emergency Use Authorization (EUA). This EUA will remain  in effect (meaning this test can be used) for the duration of the COVID-19 declaration under Section 564(b)(1) of the Act, 21 U.S.C. section 360bbb-3(b)(1), unless the  authorization is terminated or revoked sooner.     Influenza A by PCR NEGATIVE NEGATIVE Final   Influenza B by PCR NEGATIVE NEGATIVE Final    Comment: (NOTE) The Xpert Xpress SARS-CoV-2/FLU/RSV assay is intended as an aid in  the diagnosis of influenza from Nasopharyngeal swab specimens and  should not be used as a sole basis for treatment. Nasal washings and  aspirates are unacceptable for Xpert Xpress SARS-CoV-2/FLU/RSV  testing.  Fact Sheet for Patients: https://www.moore.com/  Fact Sheet for Healthcare Providers: https://www.young.biz/  This test is not yet approved or cleared by the [place] FDA and  has been authorized for detection and/or diagnosis of SARS-CoV-2 by  FDA under an Emergency Use Authorization (EUA). This EUA will remain  in effect (meaning this test can be used) for the duration of the  Covid-19 declaration under Section 564(b)(1) of the Act, 21  U.S.C. section 360bbb-3(b)(1), unless the authorization is  terminated or revoked. Performed at [facility], [address] [place], [place] [address]   Gastrointestinal Panel by PCR , Stool     Status: None   Collection Time: 04/28/20  5:42 AM   Specimen: Stool  Result Value Ref Range Status   Campylobacter species NOT DETECTED NOT DETECTED Final   Plesimonas shigelloides NOT DETECTED NOT DETECTED Final   Salmonella species NOT DETECTED NOT DETECTED Final   Yersinia enterocolitica NOT DETECTED NOT DETECTED Final   Vibrio species NOT DETECTED NOT DETECTED Final   Vibrio cholerae NOT DETECTED NOT DETECTED Final   Enteroaggregative E coli (EAEC) NOT DETECTED NOT DETECTED Final   Enteropathogenic E coli (EPEC) NOT DETECTED NOT DETECTED Final   Enterotoxigenic E coli (ETEC) NOT DETECTED NOT DETECTED Final   Shiga like toxin producing E coli (STEC) NOT DETECTED NOT DETECTED Final   Shigella/Enteroinvasive E coli (EIEC) NOT DETECTED NOT DETECTED Final    Cryptosporidium NOT DETECTED NOT DETECTED Final   Cyclospora cayetanensis NOT DETECTED NOT DETECTED Final   Entamoeba histolytica NOT DETECTED NOT DETECTED Final   Giardia lamblia NOT DETECTED NOT DETECTED Final   Adenovirus F40/41 NOT DETECTED NOT DETECTED Final   Astrovirus NOT DETECTED NOT DETECTED Final   Norovirus GI/GII NOT DETECTED NOT DETECTED Final   Rotavirus A NOT DETECTED NOT DETECTED Final   Sapovirus (I, II, IV, and V) NOT DETECTED NOT DETECTED Final    Comment: Performed at [facility], [address], [place], [place] [address]  C Difficile Quick Screen w PCR reflex     Status: None   Collection Time: 04/28/20  5:42 AM   Specimen: STOOL  Result Value Ref Range Status  C Diff antigen NEGATIVE NEGATIVE Final   C Diff toxin NEGATIVE NEGATIVE Final   C Diff interpretation No C. difficile detected.  Final    Comment: Performed at [facility], [address] [place], [place] [address]     Labs: BNP (last 3 results) No results for input(s): BNP in the last 8760 hours. Basic Metabolic Panel: Recent Labs  Lab 04/27/20 1834 04/28/20 0844 04/29/20 0458  NA 142 139 140  K 4.4 4.2 3.4*  CL 101 106 108  CO2 25 26 23   GLUCOSE 136* 97 80  BUN 35* 35* 22  CREATININE 1.85* 1.69* 1.20  CALCIUM 10.2 8.2* 8.0*   Liver Function Tests: Recent Labs  Lab 04/27/20 1834  AST 34  ALT 20  ALKPHOS 107  BILITOT 1.6*  PROT 8.8*  ALBUMIN 4.9   Recent Labs  Lab 04/27/20 1834  LIPASE 28   No results for input(s): AMMONIA in the last 168 hours. CBC: Recent Labs  Lab 04/27/20 1834 04/28/20 0844 04/28/20 1435 04/29/20 0458  WBC 19.5* 9.5 8.9 7.6  HGB 18.6* 13.5 13.6 13.0  HCT 55.5* 40.7 41.1 38.9*  MCV 84.7 86.0 85.8 85.7  PLT 293 202 195 187   Cardiac Enzymes: No results for input(s): CKTOTAL, CKMB, CKMBINDEX, TROPONINI in the last 168 hours. BNP: Invalid input(s): POCBNP CBG: No results for input(s): GLUCAP in the last 168  hours. D-Dimer No results for input(s): DDIMER in the last 72 hours. Hgb A1c No results for input(s): HGBA1C in the last 72 hours. Lipid Profile No results for input(s): CHOL, HDL, LDLCALC, TRIG, CHOLHDL, LDLDIRECT in the last 72 hours. Thyroid function studies No results for input(s): TSH, T4TOTAL, T3FREE, THYROIDAB in the last 72 hours.  Invalid input(s): FREET3 Anemia work up No results for input(s): VITAMINB12, FOLATE, FERRITIN, TIBC, IRON, RETICCTPCT in the last 72 hours. Urinalysis    Component Value Date/Time   COLORURINE YELLOW (A) 04/28/2020 0545   APPEARANCEUR HAZY (A) 04/28/2020 0545   LABSPEC >1.046 (H) 04/28/2020 0545   PHURINE 5.0 04/28/2020 0545   GLUCOSEU NEGATIVE 04/28/2020 0545   HGBUR NEGATIVE 04/28/2020 0545   BILIRUBINUR NEGATIVE 04/28/2020 0545   KETONESUR NEGATIVE 04/28/2020 0545   PROTEINUR NEGATIVE 04/28/2020 0545   NITRITE NEGATIVE 04/28/2020 0545   LEUKOCYTESUR NEGATIVE 04/28/2020 0545   Sepsis Labs Invalid input(s): PROCALCITONIN,  WBC,  LACTICIDVEN Microbiology Recent Results (from the past 240 hour(s))  Blood culture (routine x 2)     Status: None (Preliminary result)   Collection Time: 04/27/20  7:30 PM   Specimen: BLOOD  Result Value Ref Range Status   Specimen Description BLOOD BLOOD LEFT FOREARM  Final   Special Requests   Final    BOTTLES DRAWN AEROBIC AND ANAEROBIC Blood Culture adequate volume   Culture   Final    NO GROWTH 2 DAYS Performed at [facility], [address], [place], [place]    Report Status PENDING  Incomplete  Blood culture (routine x 2)     Status: None (Preliminary result)   Collection Time: 04/27/20  8:21 PM   Specimen: BLOOD  Result Value Ref Range Status   Specimen Description BLOOD BLOOD RIGHT FOREARM  Final   Special Requests   Final    BOTTLES DRAWN AEROBIC AND ANAEROBIC Blood Culture results may not be optimal due to an inadequate volume of blood received in culture bottles   Culture    Final    NO GROWTH 2 DAYS Performed at [facility], [address] [place], [place] [address]    Report Status PENDING  Incomplete  Respiratory Panel by RT PCR (Flu A&B, Covid) - Nasopharyngeal Swab     Status: None   Collection Time: 04/27/20  8:21 PM   Specimen: Nasopharyngeal Swab  Result Value Ref Range Status   SARS Coronavirus 2 by RT PCR NEGATIVE NEGATIVE Final    Comment: (NOTE) SARS-CoV-2 target nucleic acids are NOT DETECTED.  The SARS-CoV-2 RNA is generally detectable in upper respiratoy specimens during the acute phase of infection. The lowest concentration of SARS-CoV-2 viral copies this assay can detect is 131 copies/mL. A negative result does not preclude SARS-Cov-2 infection and should not be used as the sole basis for treatment or other patient management decisions. A negative result may occur with  improper specimen collection/handling, submission of specimen other than nasopharyngeal swab, presence of viral mutation(s) within the areas targeted by this assay, and inadequate number of viral copies (<131 copies/mL). A negative result must be combined with clinical observations, patient history, and epidemiological information. The expected result is Negative.  Fact Sheet for Patients:  https://www.moore.com/https://www.fda.gov/media/142436/download  Fact Sheet for Healthcare Providers:  https://www.young.biz/https://www.fda.gov/media/142435/download  This test is no t yet approved or cleared by the [place] FDA and  has been authorized for detection and/or diagnosis of SARS-CoV-2 by FDA under an Emergency Use Authorization (EUA). This EUA will remain  in effect (meaning this test can be used) for the duration of the COVID-19 declaration under Section 564(b)(1) of the Act, 21 U.S.C. section 360bbb-3(b)(1), unless the authorization is terminated or revoked sooner.     Influenza A by PCR NEGATIVE NEGATIVE Final   Influenza B by PCR NEGATIVE NEGATIVE Final    Comment: (NOTE) The Xpert  Xpress SARS-CoV-2/FLU/RSV assay is intended as an aid in  the diagnosis of influenza from Nasopharyngeal swab specimens and  should not be used as a sole basis for treatment. Nasal washings and  aspirates are unacceptable for Xpert Xpress SARS-CoV-2/FLU/RSV  testing.  Fact Sheet for Patients: https://www.moore.com/https://www.fda.gov/media/142436/download  Fact Sheet for Healthcare Providers: https://www.young.biz/https://www.fda.gov/media/142435/download  This test is not yet approved or cleared by the [place] FDA and  has been authorized for detection and/or diagnosis of SARS-CoV-2 by  FDA under an Emergency Use Authorization (EUA). This EUA will remain  in effect (meaning this test can be used) for the duration of the  Covid-19 declaration under Section 564(b)(1) of the Act, 21  U.S.C. section 360bbb-3(b)(1), unless the authorization is  terminated or revoked. Performed at [facility], [address] [place], [place] [address]   Gastrointestinal Panel by PCR , Stool     Status: None   Collection Time: 04/28/20  5:42 AM   Specimen: Stool  Result Value Ref Range Status   Campylobacter species NOT DETECTED NOT DETECTED Final   Plesimonas shigelloides NOT DETECTED NOT DETECTED Final   Salmonella species NOT DETECTED NOT DETECTED Final   Yersinia enterocolitica NOT DETECTED NOT DETECTED Final   Vibrio species NOT DETECTED NOT DETECTED Final   Vibrio cholerae NOT DETECTED NOT DETECTED Final   Enteroaggregative E coli (EAEC) NOT DETECTED NOT DETECTED Final   Enteropathogenic E coli (EPEC) NOT DETECTED NOT DETECTED Final   Enterotoxigenic E coli (ETEC) NOT DETECTED NOT DETECTED Final   Shiga like toxin producing E coli (STEC) NOT DETECTED NOT DETECTED Final   Shigella/Enteroinvasive E coli (EIEC) NOT DETECTED NOT DETECTED Final   Cryptosporidium NOT DETECTED NOT DETECTED Final   Cyclospora cayetanensis  NOT DETECTED NOT DETECTED Final   Entamoeba histolytica NOT DETECTED NOT DETECTED Final   Giardia lamblia  NOT DETECTED NOT DETECTED Final   Adenovirus F40/41 NOT DETECTED NOT DETECTED Final   Astrovirus NOT DETECTED NOT DETECTED Final   Norovirus GI/GII NOT DETECTED NOT DETECTED Final   Rotavirus A NOT DETECTED NOT DETECTED Final   Sapovirus (I, II, IV, and V) NOT DETECTED NOT DETECTED Final    Comment: Performed at [facility], [address] [place], [place] [address]  C Difficile Quick Screen w PCR reflex     Status: None   Collection Time: 04/28/20  5:42 AM   Specimen: STOOL  Result Value Ref Range Status   C Diff antigen NEGATIVE NEGATIVE Final   C Diff toxin NEGATIVE NEGATIVE Final   C Diff interpretation No C. difficile detected.  Final    Comment: Performed at [facility], [address] [place], [place] [address]    Time coordinating discharge: Over 30 minutes  SIGNED:  Arnetha Courser, MD  [facility] 04/29/2020, 10:55 AM  If 7PM-7AM, please contact night-coverage [email or web address]  This record has been created using Conservation officer, historic buildings. Errors have been sought and corrected,but may not always be located. Such creation errors do not reflect on the standard of care.

## 2020-05-02 LAB — CULTURE, BLOOD (ROUTINE X 2)
Culture: NO GROWTH
Culture: NO GROWTH
Special Requests: ADEQUATE

## 2022-02-09 IMAGING — CT CT ABD-PELV W/ CM
2 of 5 series · 15 of 46 positions shown, 17 images · IV contrast (APPLIED)
Comparison: None.

CLINICAL DATA: 88-year-old with abdominal pain and fever. Diarrhea
and vomiting.

EXAM:
CT ABDOMEN AND PELVIS WITH CONTRAST
TECHNIQUE: Multidetector CT imaging of the abdomen and pelvis was performed
using the standard protocol following bolus administration of
intravenous contrast.
CONTRAST:  75mL OMNIPAQUE IOHEXOL 300 MG/ML  SOLN

[Series 2: routine abd/pel with · axial · 0.76mm/px · z∈[-498,-18]mm · 12 of 108 slices shown, 14 images]
[im 6/108  soft-tissue]
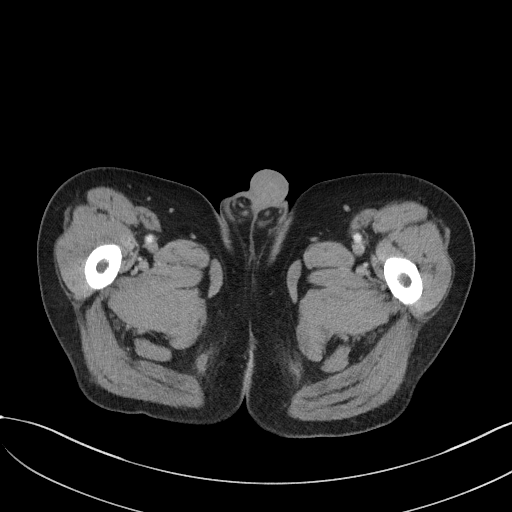
[im 6/108  bone]
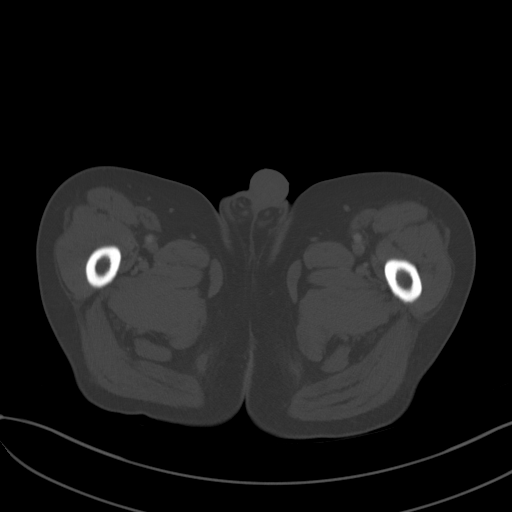
[im 18/108  soft-tissue]
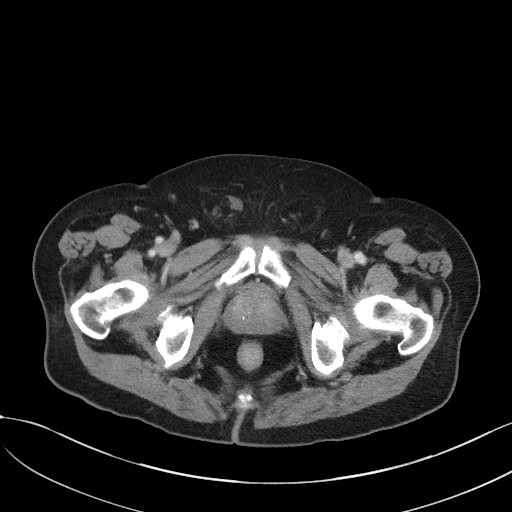
[im 24/108  soft-tissue]
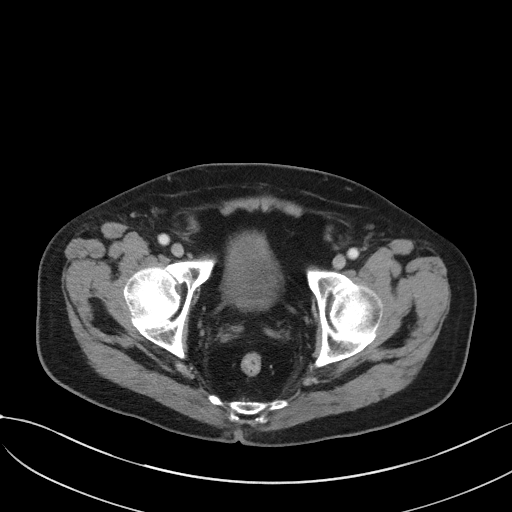
[im 30/108  soft-tissue]
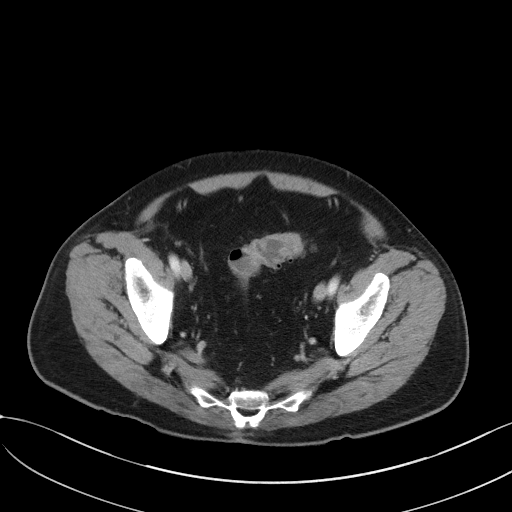
[im 42/108  soft-tissue]
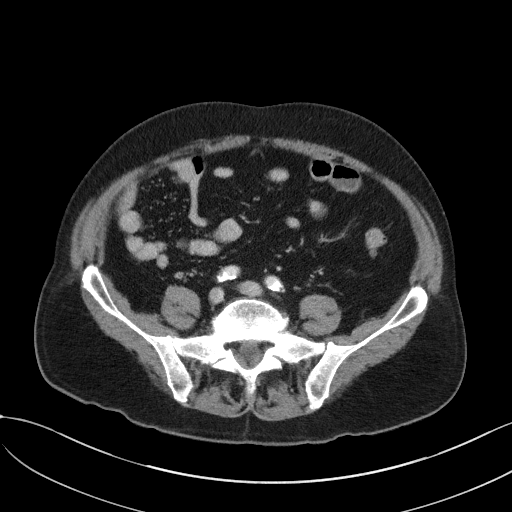
[im 48/108  soft-tissue]
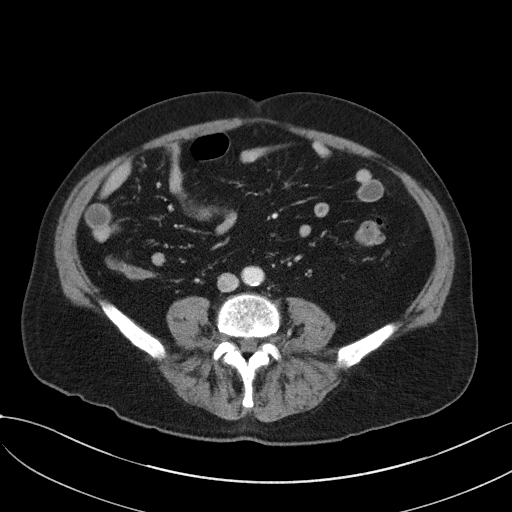
[im 60/108  soft-tissue]
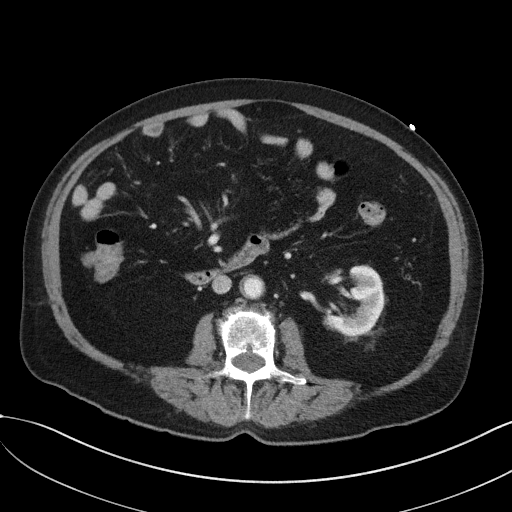
[im 66/108  soft-tissue]
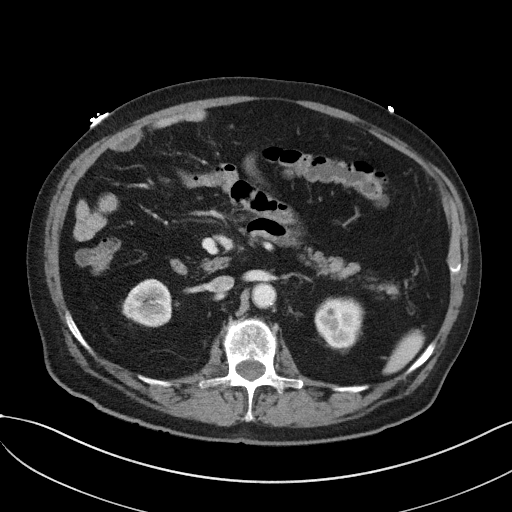
[im 78/108  soft-tissue]
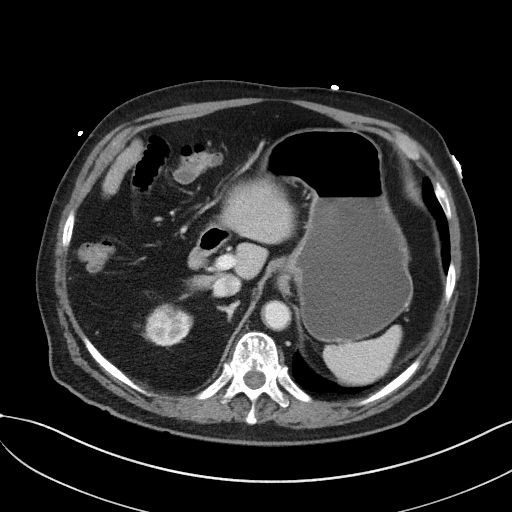
[im 78/108  bone]
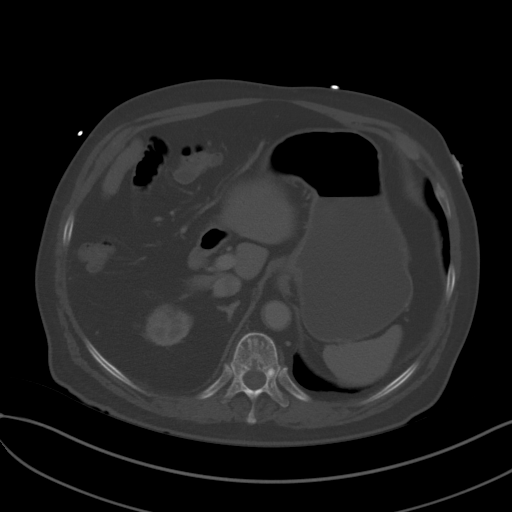
[im 84/108  soft-tissue]
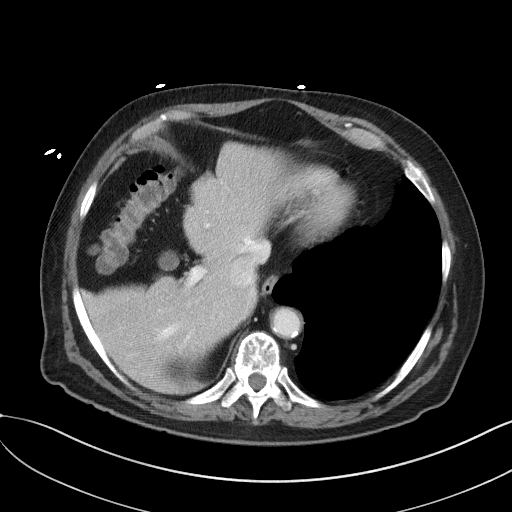
[im 90/108  soft-tissue]
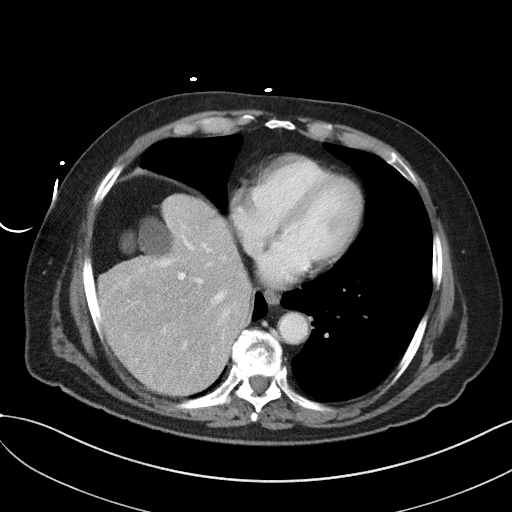
[im 102/108  soft-tissue]
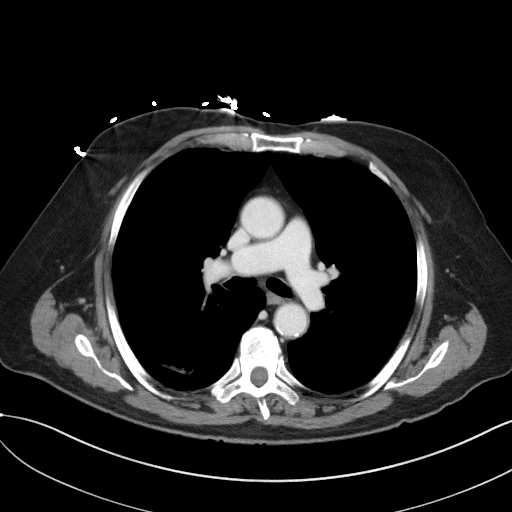

[Series 5: coronal st · coronal · 0.81mm/px · 3 of 94 slices shown]
[im 32/94  soft-tissue]
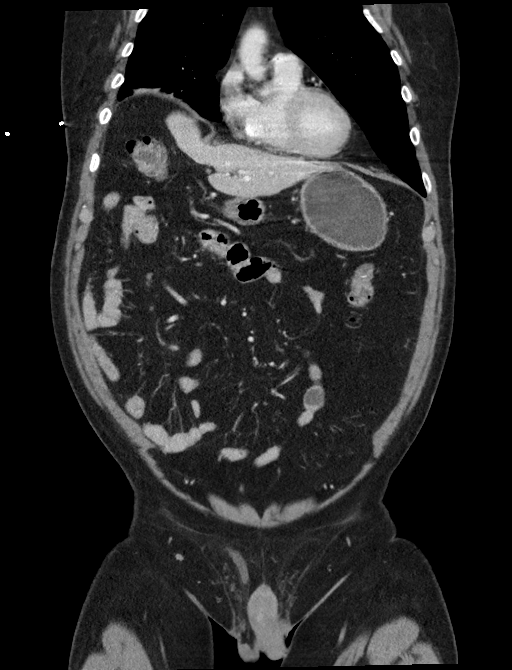
[im 42/94  soft-tissue]
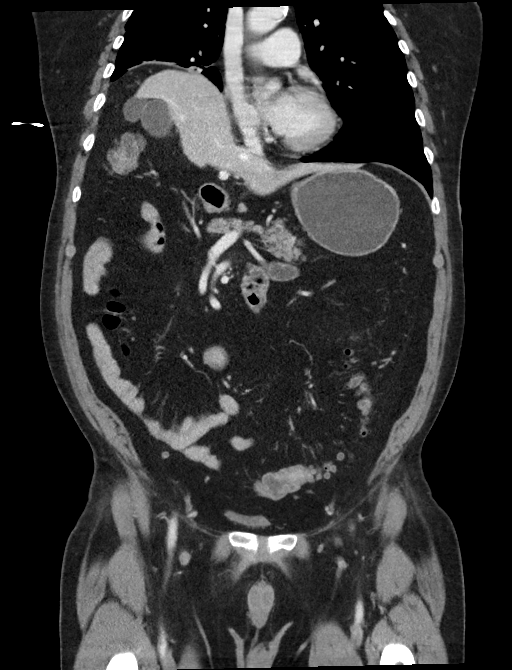
[im 52/94  soft-tissue]
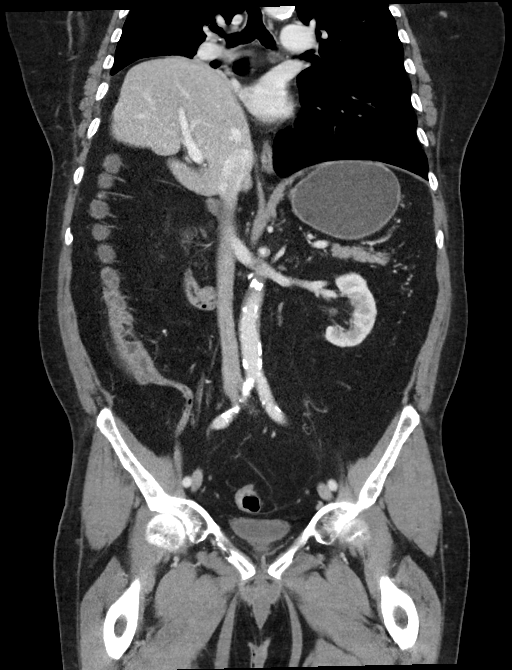

[15 of 46 positions shown; findings below may reference images not displayed]

FINDINGS: Lower chest: Elevated right hemidiaphragm with chronic atelectasis
in the right lower and right middle lobe. Atherosclerosis of the
thoracic aorta. The heart is normal in size.

Hepatobiliary: Calcified granuloma in the right dome of the liver.
No suspicious hepatic lesion. Gallbladder physiologically distended,
no calcified stone. No biliary dilatation.

Pancreas: No ductal dilatation or inflammation.

Spleen: Normal in size without focal abnormality.

Adrenals/Urinary Tract: Normal adrenal glands. No hydronephrosis.
Homogeneous renal enhancement. Mild symmetric perinephric edema,
nonspecific. No visualized renal calculi. Small cortical low-density
in the lower left kidney is too small to accurately characterize,
likely small cyst. Absent renal excretion on delayed phase imaging.
Urinary bladder is minimally distended. No definite perivesicular
fat stranding.

Stomach/Bowel: Fluid/ingested material in the stomach. There is mild
peri pyloric gastric wall thickening with minimal adjacent fat
stranding, series 2, image 34. Duodenum is normally positioned.
Decompressed small bowel without obstruction or inflammation. Normal
appendix, no appendicitis. Liquid stool throughout the colon. There
is no colonic wall thickening or pericolonic edema. Sigmoid
diverticulosis without focal diverticulitis or colonic inflammation.

Vascular/Lymphatic: Moderate aortic and branch atherosclerosis. No
aortic aneurysm. No acute vascular findings. Patent portal vein. No
abdominopelvic adenopathy.

Reproductive: Mildly enlarged prostate gland spanning 4.7 cm
transverse causing mass effect on the bladder base.

Other: Minimal fat in the inguinal canals. Trace fluid in the right
inguinal canal. No ascites. No free air. No intra-abdominal abscess.

Musculoskeletal: There are no acute or suspicious osseous
abnormalities.
IMPRESSION: 1. Peri pyloric gastric wall thickening with minimal adjacent fat
stranding, suspicious for gastritis or peptic ulcer disease.
2. Liquid stool throughout the colon, can be seen with diarrheal
illness. No colonic wall thickening or pericolonic edema to suggest
colitis.
3. Absent renal excretion on delayed phase imaging can be seen with
renal dysfunction.
4. Mildly enlarged prostate gland causing mass effect on the bladder
base.
5. Sigmoid diverticulosis without diverticulitis.

Aortic Atherosclerosis ([id number]).

## 2022-02-10 IMAGING — US US RENAL
1 series · 14 of 25 positions shown · non-contrast
Comparison: CT from the previous day.

CLINICAL DATA: Acute renal injury

EXAM:
RENAL / URINARY TRACT ULTRASOUND COMPLETE

[Series 1: us renal · 14 of 42 slices shown]
[im 1/42]
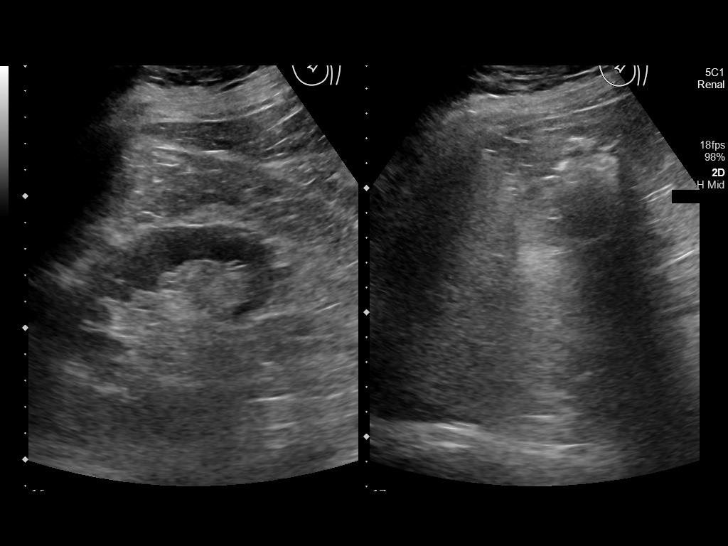
[im 4/42]
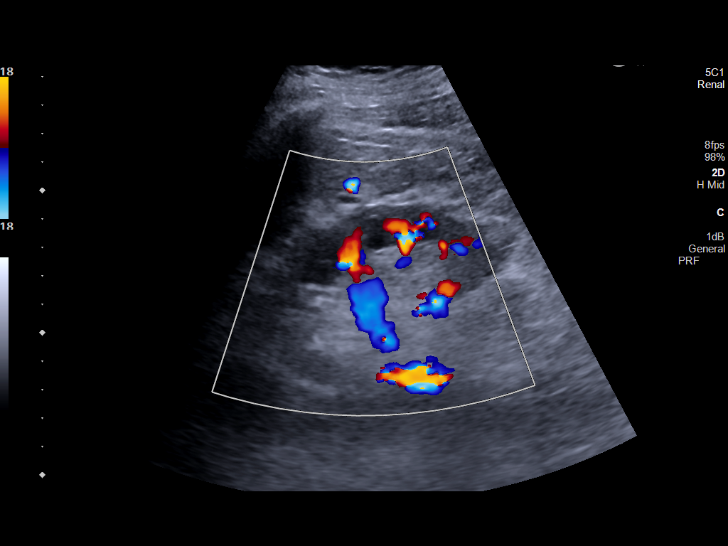
[im 7/42]
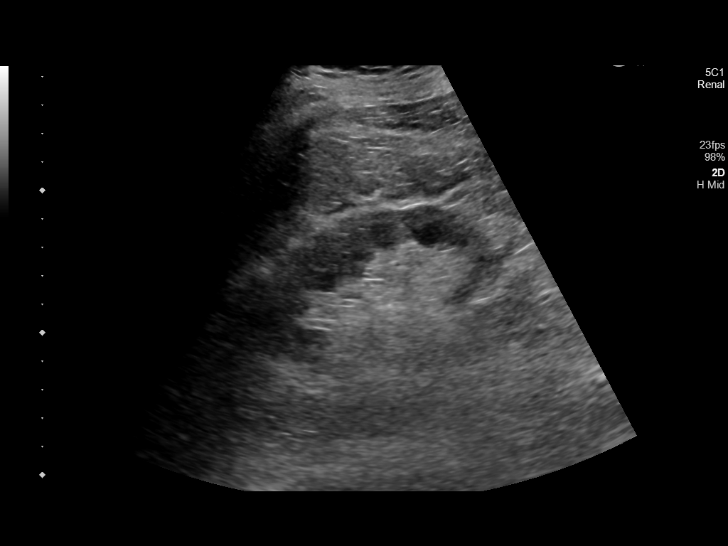
[im 11/42]
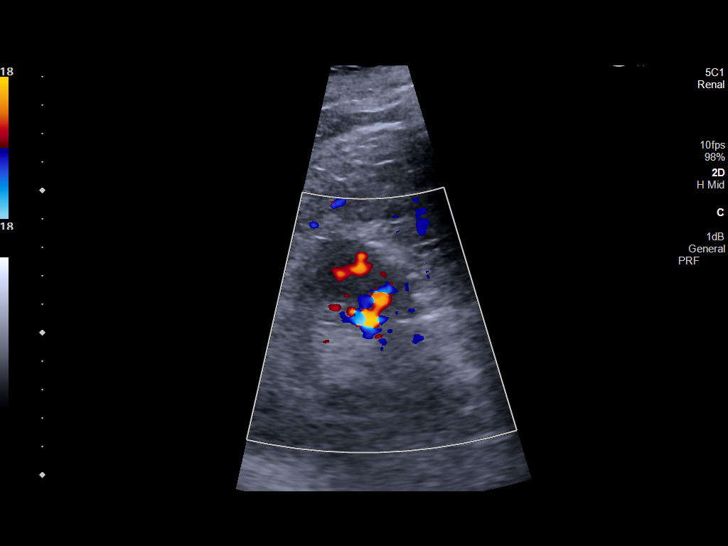
[im 14/42]
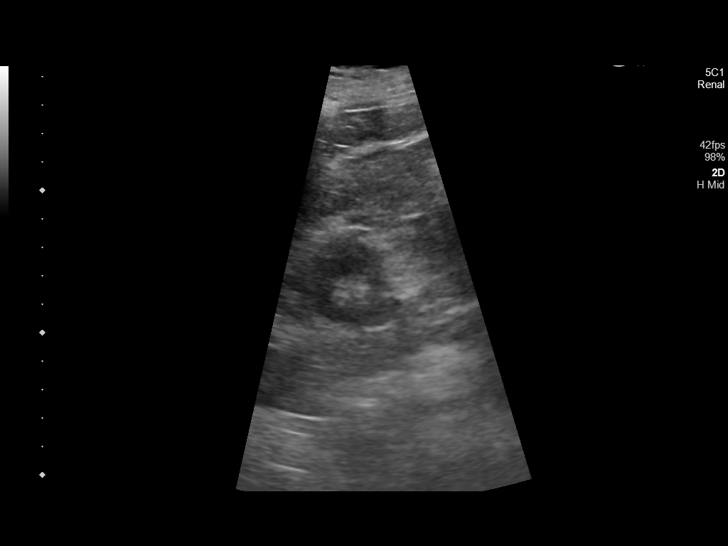
[im 16/42]
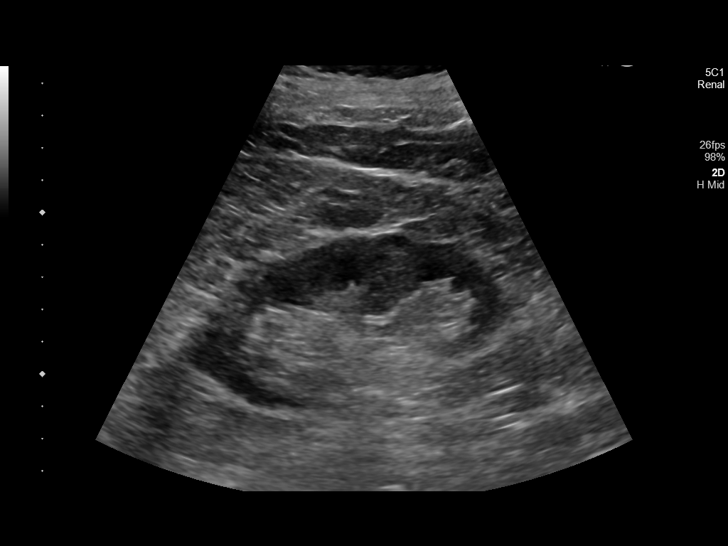
[im 19/42]
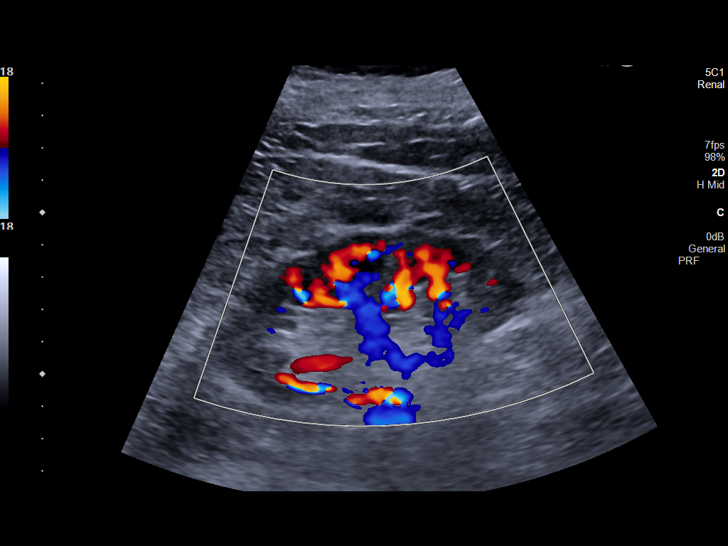
[im 23/42]
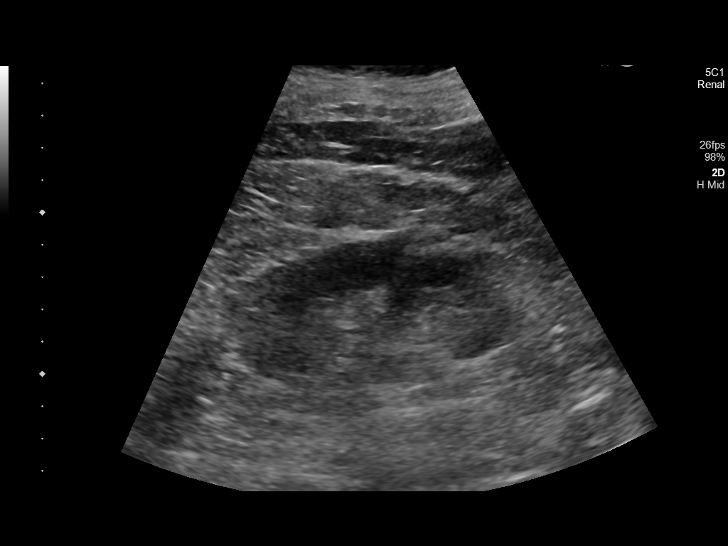
[im 26/42]
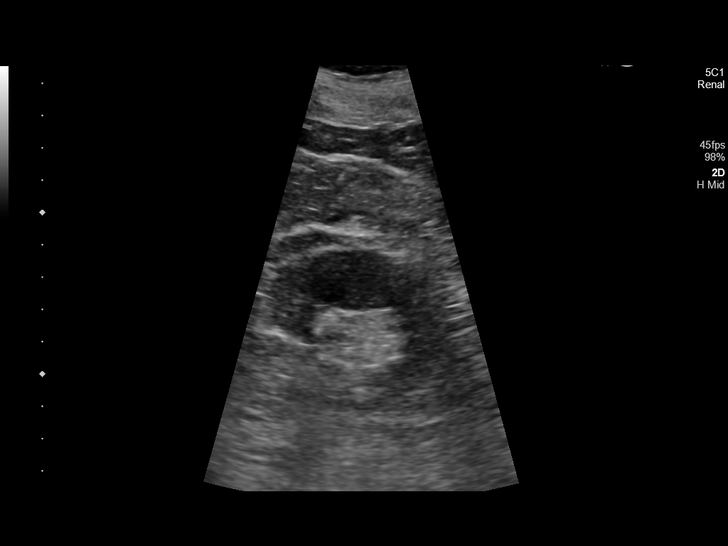
[im 28/42]
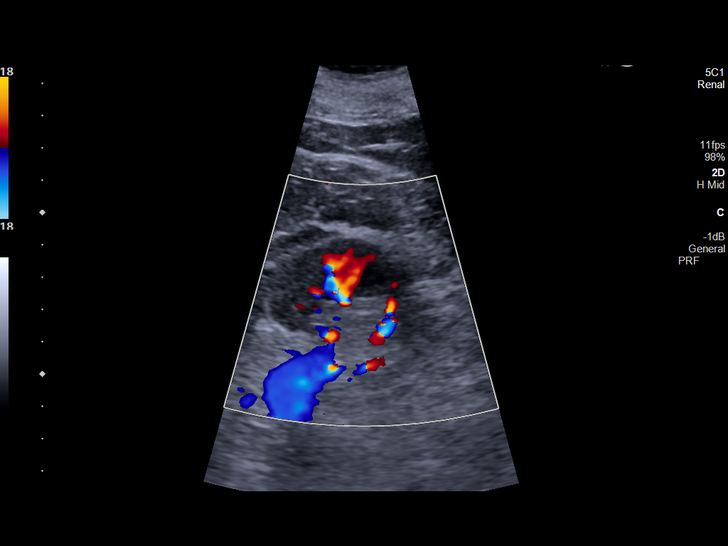
[im 31/42]
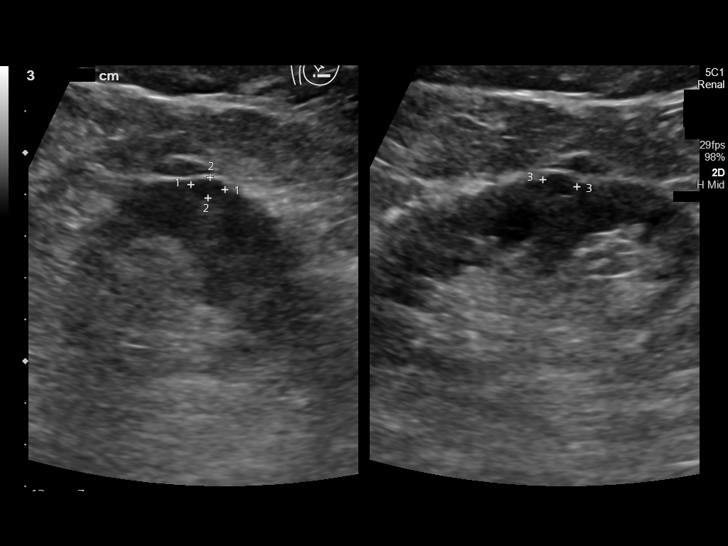
[im 35/42]
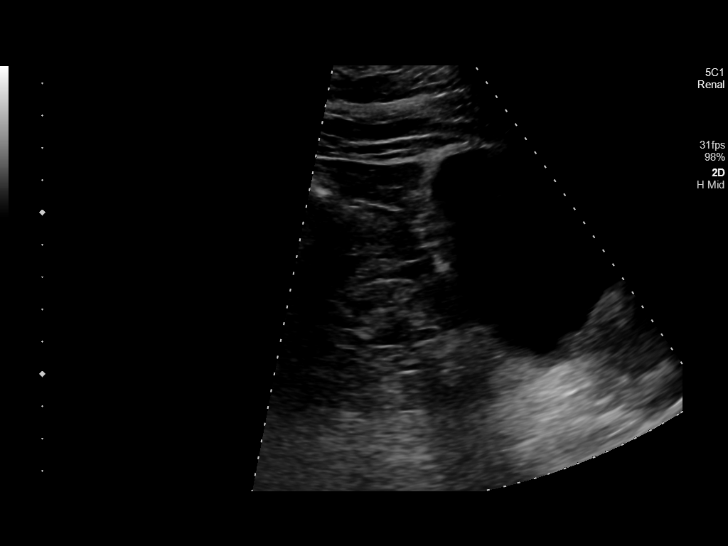
[im 38/42]
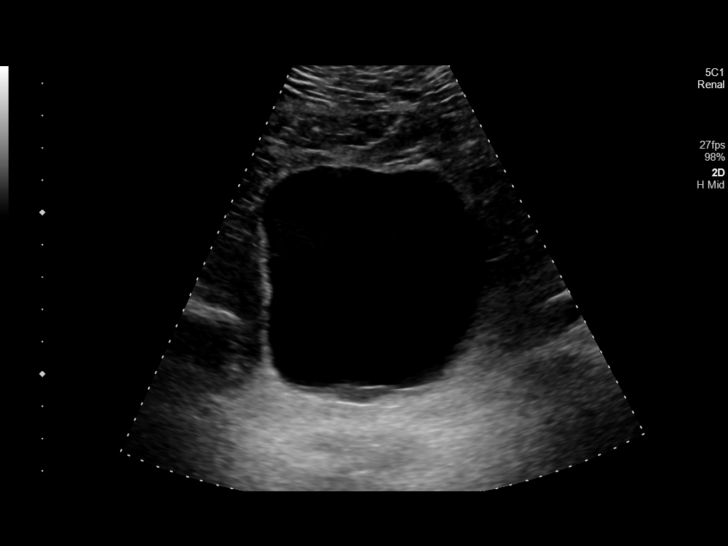
[im 42/42]
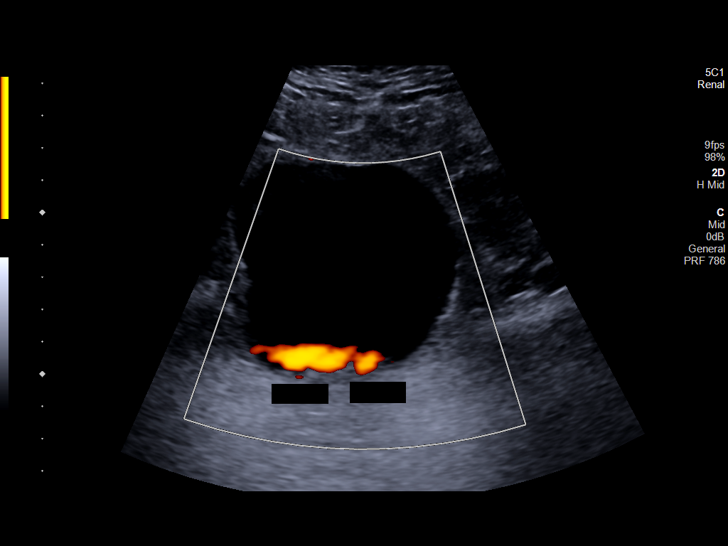

[14 of 25 positions shown; findings below may reference images not displayed]

FINDINGS: Right Kidney:

Renal measurements: 8.6 x 4.4 x 3.9 cm. = volume: 78 mL.
Echogenicity within normal limits. No mass or hydronephrosis
visualized.

Left Kidney:

Renal measurements: 10.1 x 4.7 x 3.6 cm = volume: 88 mL. 8 mm cyst
is noted in the midportion of the left kidney. This corresponds to
the lesions seen on prior CT examination.

Bladder:

Appears normal for degree of bladder distention.

Other:

None.
IMPRESSION: Tiny left renal cyst.

No other focal abnormality is noted.
